# Patient Record
Sex: Female | Born: 1950 | Race: White | Hispanic: No | Marital: Married | Smoking: Never smoker
Health system: Southern US, Community
[De-identification: ages and names within clinical notes are randomized; demographics above are authoritative.]

## PROBLEM LIST (undated history)

## (undated) DIAGNOSIS — K449 Diaphragmatic hernia without obstruction or gangrene: Secondary | ICD-10-CM

## (undated) DIAGNOSIS — K219 Gastro-esophageal reflux disease without esophagitis: Secondary | ICD-10-CM

## (undated) DIAGNOSIS — E785 Hyperlipidemia, unspecified: Secondary | ICD-10-CM

## (undated) DIAGNOSIS — D649 Anemia, unspecified: Secondary | ICD-10-CM

## (undated) DIAGNOSIS — I82409 Acute embolism and thrombosis of unspecified deep veins of unspecified lower extremity: Secondary | ICD-10-CM

## (undated) DIAGNOSIS — L900 Lichen sclerosus et atrophicus: Principal | ICD-10-CM

## (undated) DIAGNOSIS — N814 Uterovaginal prolapse, unspecified: Secondary | ICD-10-CM

## (undated) DIAGNOSIS — F419 Anxiety disorder, unspecified: Secondary | ICD-10-CM

## (undated) DIAGNOSIS — M1990 Unspecified osteoarthritis, unspecified site: Secondary | ICD-10-CM

## (undated) DIAGNOSIS — K589 Irritable bowel syndrome without diarrhea: Secondary | ICD-10-CM

## (undated) HISTORY — DX: Uterovaginal prolapse, unspecified: N81.4

## (undated) HISTORY — DX: Hyperlipidemia, unspecified: E78.5

## (undated) HISTORY — DX: Lichen sclerosus et atrophicus: L90.0

## (undated) HISTORY — DX: Anxiety disorder, unspecified: F41.9

## (undated) HISTORY — DX: Irritable bowel syndrome, unspecified: K58.9

## (undated) HISTORY — DX: Diaphragmatic hernia without obstruction or gangrene: K44.9

---

## 2000-02-16 HISTORY — PX: BREAST BIOPSY: SHX20

## 2004-02-05 ENCOUNTER — Ambulatory Visit: Payer: Self-pay | Admitting: Internal Medicine

## 2004-04-07 ENCOUNTER — Ambulatory Visit: Payer: Self-pay | Admitting: Internal Medicine

## 2004-05-12 ENCOUNTER — Ambulatory Visit: Payer: Self-pay | Admitting: Unknown Physician Specialty

## 2004-09-10 ENCOUNTER — Observation Stay: Payer: Self-pay | Admitting: Internal Medicine

## 2005-01-14 ENCOUNTER — Emergency Department: Payer: Self-pay | Admitting: Emergency Medicine

## 2005-07-14 ENCOUNTER — Encounter: Admission: RE | Admit: 2005-07-14 | Discharge: 2005-07-14 | Payer: Self-pay | Admitting: Unknown Physician Specialty

## 2005-09-15 DIAGNOSIS — R600 Localized edema: Secondary | ICD-10-CM | POA: Insufficient documentation

## 2005-10-16 ENCOUNTER — Other Ambulatory Visit: Payer: Self-pay

## 2005-10-16 ENCOUNTER — Emergency Department: Payer: Self-pay | Admitting: Unknown Physician Specialty

## 2005-11-25 ENCOUNTER — Ambulatory Visit: Payer: Self-pay | Admitting: Internal Medicine

## 2006-04-20 ENCOUNTER — Inpatient Hospital Stay: Payer: Self-pay | Admitting: Internal Medicine

## 2006-04-20 ENCOUNTER — Ambulatory Visit: Payer: Self-pay | Admitting: Unknown Physician Specialty

## 2006-08-23 ENCOUNTER — Other Ambulatory Visit: Payer: Self-pay

## 2006-08-23 ENCOUNTER — Emergency Department: Payer: Self-pay | Admitting: Emergency Medicine

## 2006-08-29 ENCOUNTER — Ambulatory Visit: Payer: Self-pay | Admitting: Unknown Physician Specialty

## 2006-09-09 ENCOUNTER — Ambulatory Visit: Payer: Self-pay | Admitting: Internal Medicine

## 2007-01-31 ENCOUNTER — Ambulatory Visit: Payer: Self-pay

## 2007-09-15 ENCOUNTER — Ambulatory Visit: Payer: Self-pay | Admitting: Unknown Physician Specialty

## 2007-09-19 ENCOUNTER — Ambulatory Visit: Payer: Self-pay | Admitting: Unknown Physician Specialty

## 2007-12-13 ENCOUNTER — Ambulatory Visit: Payer: Self-pay

## 2008-02-16 HISTORY — PX: ANKLE SURGERY: SHX546

## 2008-02-20 ENCOUNTER — Ambulatory Visit: Payer: Self-pay | Admitting: Unknown Physician Specialty

## 2008-03-07 ENCOUNTER — Ambulatory Visit: Payer: Self-pay | Admitting: Unknown Physician Specialty

## 2008-03-28 ENCOUNTER — Ambulatory Visit: Payer: Self-pay | Admitting: Podiatry

## 2008-03-29 ENCOUNTER — Ambulatory Visit: Payer: Self-pay | Admitting: Podiatry

## 2008-07-01 ENCOUNTER — Encounter: Admission: RE | Admit: 2008-07-01 | Discharge: 2008-07-01 | Payer: Self-pay | Admitting: Orthopedic Surgery

## 2008-10-16 ENCOUNTER — Emergency Department (HOSPITAL_COMMUNITY): Admission: EM | Admit: 2008-10-16 | Discharge: 2008-10-16 | Payer: Self-pay | Admitting: Emergency Medicine

## 2009-03-11 ENCOUNTER — Ambulatory Visit: Payer: Self-pay | Admitting: Unknown Physician Specialty

## 2009-03-13 ENCOUNTER — Ambulatory Visit: Payer: Self-pay | Admitting: Unknown Physician Specialty

## 2009-03-18 ENCOUNTER — Ambulatory Visit: Payer: Self-pay | Admitting: Unknown Physician Specialty

## 2009-05-06 ENCOUNTER — Ambulatory Visit: Payer: Self-pay | Admitting: Orthopedic Surgery

## 2009-05-16 ENCOUNTER — Emergency Department: Payer: Self-pay | Admitting: Emergency Medicine

## 2009-06-18 ENCOUNTER — Encounter: Admission: RE | Admit: 2009-06-18 | Discharge: 2009-06-18 | Payer: Self-pay | Admitting: Orthopedic Surgery

## 2009-06-22 ENCOUNTER — Emergency Department: Payer: Self-pay | Admitting: Emergency Medicine

## 2010-02-10 ENCOUNTER — Ambulatory Visit: Payer: Self-pay | Admitting: Unknown Physician Specialty

## 2010-02-15 HISTORY — PX: SHOULDER SURGERY: SHX246

## 2010-02-23 ENCOUNTER — Ambulatory Visit: Payer: Self-pay | Admitting: Unknown Physician Specialty

## 2010-05-20 ENCOUNTER — Ambulatory Visit: Payer: Self-pay | Admitting: Unknown Physician Specialty

## 2010-05-22 LAB — BASIC METABOLIC PANEL
CO2: 27 mEq/L (ref 19–32)
Calcium: 9 mg/dL (ref 8.4–10.5)
Chloride: 106 mEq/L (ref 96–112)
Creatinine, Ser: 0.78 mg/dL (ref 0.4–1.2)
GFR calc Af Amer: >60 mL/min (ref >60)
GFR calc non Af Amer: >60 mL/min (ref >60)
Sodium: 139 mEq/L (ref 135–145)

## 2010-05-22 LAB — DIFFERENTIAL
Basophils Relative: 0 % (ref 0–1)
Monocytes Relative: 7 % (ref 3–12)
Neutrophils Relative %: 68 % (ref 43–77)

## 2010-05-22 LAB — CBC
MCV: 78.7 fL (ref 78.0–100.0)
RBC: 4.4 MIL/uL (ref 3.87–5.11)
RDW: 14.6 % (ref 11.5–15.5)
WBC: 11.6 10*3/uL — ABNORMAL HIGH (ref 4.0–10.5)

## 2010-05-22 LAB — POCT CARDIAC MARKERS: Myoglobin, poc: 61.4 ng/mL (ref 12–200)

## 2010-07-28 ENCOUNTER — Ambulatory Visit: Payer: Self-pay | Admitting: Cardiology

## 2010-09-19 ENCOUNTER — Emergency Department: Payer: Self-pay | Admitting: Emergency Medicine

## 2011-03-15 ENCOUNTER — Ambulatory Visit: Payer: Self-pay

## 2011-07-14 ENCOUNTER — Ambulatory Visit: Payer: Self-pay | Admitting: Unknown Physician Specialty

## 2011-07-16 ENCOUNTER — Ambulatory Visit: Payer: Self-pay | Admitting: Unknown Physician Specialty

## 2011-11-02 ENCOUNTER — Ambulatory Visit: Payer: Self-pay | Admitting: Unknown Physician Specialty

## 2011-11-12 ENCOUNTER — Other Ambulatory Visit: Payer: Self-pay | Admitting: Internal Medicine

## 2011-11-18 LAB — CULTURE, BLOOD (SINGLE)

## 2011-12-02 ENCOUNTER — Ambulatory Visit: Payer: Self-pay | Admitting: Unknown Physician Specialty

## 2012-01-31 ENCOUNTER — Ambulatory Visit: Payer: Self-pay | Admitting: Unknown Physician Specialty

## 2012-02-02 LAB — PATHOLOGY REPORT

## 2012-02-16 HISTORY — PX: HAND SURGERY: SHX662

## 2012-03-09 ENCOUNTER — Encounter: Payer: Self-pay | Admitting: Rheumatology

## 2012-03-18 ENCOUNTER — Encounter: Payer: Self-pay | Admitting: Rheumatology

## 2012-04-15 ENCOUNTER — Encounter: Payer: Self-pay | Admitting: Rheumatology

## 2012-05-30 ENCOUNTER — Ambulatory Visit: Payer: Self-pay | Admitting: Hematology and Oncology

## 2012-05-30 LAB — CBC CANCER CENTER
Basophil %: 0.8 %
Eosinophil #: 0.3 x10 3/mm (ref 0.0–0.7)
Eosinophil %: 3.2 %
HCT: 38.9 % (ref 35.0–47.0)
HGB: 12.9 g/dL (ref 12.0–16.0)
Lymphocyte #: 1.8 x10 3/mm (ref 1.0–3.6)
Lymphocyte %: 22.1 %
MCH: 25.8 pg — ABNORMAL LOW (ref 26.0–34.0)
MCHC: 33.2 g/dL (ref 32.0–36.0)
Monocyte %: 8.4 %
Neutrophil %: 65.5 %
RDW: 14.9 % — ABNORMAL HIGH (ref 11.5–14.5)

## 2012-05-30 LAB — FERRITIN: Ferritin (ARMC): 13 ng/mL (ref 8–388)

## 2012-05-30 LAB — IRON AND TIBC
Iron Bind.Cap.(Total): 415 ug/dL (ref 250–450)
Iron Saturation: 18 %
Unbound Iron-Bind.Cap.: 340 ug/dL

## 2012-06-15 ENCOUNTER — Ambulatory Visit: Payer: Self-pay | Admitting: Hematology and Oncology

## 2012-08-22 ENCOUNTER — Ambulatory Visit: Payer: Self-pay | Admitting: Unknown Physician Specialty

## 2012-09-04 ENCOUNTER — Ambulatory Visit: Payer: Self-pay | Admitting: Hematology and Oncology

## 2012-09-05 LAB — RETICULOCYTES: Reticulocyte: 1.65 %

## 2012-09-05 LAB — CBC CANCER CENTER
Basophil %: 1.4 %
Eosinophil %: 5.1 %
HCT: 35.6 % (ref 35.0–47.0)
HGB: 11.7 g/dL — ABNORMAL LOW (ref 12.0–16.0)
Lymphocyte #: 2.6 x10 3/mm (ref 1.0–3.6)
MCHC: 32.8 g/dL (ref 32.0–36.0)
Monocyte #: 0.8 x10 3/mm (ref 0.2–0.9)
Neutrophil %: 57.1 %
Platelet: 281 x10 3/mm (ref 150–440)
RBC: 4.55 10*6/uL (ref 3.80–5.20)

## 2012-09-05 LAB — IRON AND TIBC
Iron Bind.Cap.(Total): 387 ug/dL (ref 250–450)
Iron Saturation: 11 %
Unbound Iron-Bind.Cap.: 345 ug/dL

## 2012-09-15 ENCOUNTER — Ambulatory Visit: Payer: Self-pay | Admitting: Hematology and Oncology

## 2012-10-05 LAB — CBC CANCER CENTER
Basophil #: 0.1 x10 3/mm (ref 0.0–0.1)
Basophil %: 1.1 %
HCT: 38.4 % (ref 35.0–47.0)
HGB: 13.2 g/dL (ref 12.0–16.0)
Lymphocyte #: 2.4 x10 3/mm (ref 1.0–3.6)
Lymphocyte %: 27.4 %
MCHC: 34.3 g/dL (ref 32.0–36.0)
MCV: 80 fL (ref 80–100)
Monocyte %: 8.3 %
Neutrophil #: 5.2 x10 3/mm (ref 1.4–6.5)
Platelet: 229 x10 3/mm (ref 150–440)
RDW: 17 % — ABNORMAL HIGH (ref 11.5–14.5)

## 2012-10-05 LAB — IRON AND TIBC
Iron Bind.Cap.(Total): 302 ug/dL (ref 250–450)
Iron: 78 ug/dL (ref 50–170)
Unbound Iron-Bind.Cap.: 224 ug/dL

## 2012-10-16 ENCOUNTER — Ambulatory Visit: Payer: Self-pay | Admitting: Hematology and Oncology

## 2013-02-02 ENCOUNTER — Ambulatory Visit: Payer: Self-pay | Admitting: General Practice

## 2013-04-11 ENCOUNTER — Emergency Department: Payer: Self-pay | Admitting: Emergency Medicine

## 2013-08-25 ENCOUNTER — Emergency Department: Payer: Self-pay | Admitting: Emergency Medicine

## 2013-10-09 ENCOUNTER — Other Ambulatory Visit: Payer: Self-pay | Admitting: Internal Medicine

## 2013-10-09 LAB — TROPONIN I

## 2013-11-29 ENCOUNTER — Ambulatory Visit: Payer: Self-pay

## 2013-11-30 LAB — BASIC METABOLIC PANEL
Anion Gap: 8 (ref 7–16)
BUN: 24 mg/dL — ABNORMAL HIGH (ref 7–18)
CO2: 27 mmol/L (ref 21–32)
CREATININE: 0.75 mg/dL (ref 0.60–1.30)
Calcium, Total: 9.5 mg/dL (ref 8.5–10.1)
Chloride: 103 mmol/L (ref 98–107)
EGFR (African American): >60
EGFR (Non-African Amer.): >60
GLUCOSE: 104 mg/dL — AB (ref 65–99)
Osmolality: 280 (ref 275–301)
POTASSIUM: 4.2 mmol/L (ref 3.5–5.1)
SODIUM: 138 mmol/L (ref 136–145)

## 2013-11-30 LAB — IRON AND TIBC
IRON: 68 ug/dL (ref 50–170)
Iron Bind.Cap.(Total): 313 ug/dL (ref 250–450)
Iron Saturation: 22 %
UNBOUND IRON-BIND. CAP.: 245 ug/dL

## 2013-11-30 LAB — CBC CANCER CENTER
BASOS ABS: 0.1 x10 3/mm (ref 0.0–0.1)
Basophil %: 1 %
Eosinophil #: 0.2 x10 3/mm (ref 0.0–0.7)
Eosinophil %: 2.7 %
HCT: 41 % (ref 35.0–47.0)
HGB: 13.4 g/dL (ref 12.0–16.0)
LYMPHS PCT: 21.9 %
Lymphocyte #: 1.7 x10 3/mm (ref 1.0–3.6)
MCH: 27.5 pg (ref 26.0–34.0)
MCHC: 32.8 g/dL (ref 32.0–36.0)
MCV: 84 fL (ref 80–100)
MONO ABS: 0.7 x10 3/mm (ref 0.2–0.9)
Monocyte %: 8.6 %
NEUTROS ABS: 5.2 x10 3/mm (ref 1.4–6.5)
Neutrophil %: 65.8 %
PLATELETS: 280 x10 3/mm (ref 150–440)
RBC: 4.88 10*6/uL (ref 3.80–5.20)
RDW: 14.2 % (ref 11.5–14.5)
WBC: 7.9 x10 3/mm (ref 3.6–11.0)

## 2013-11-30 LAB — FERRITIN: Ferritin (ARMC): 191 ng/mL (ref 8–388)

## 2013-12-16 ENCOUNTER — Ambulatory Visit: Payer: Self-pay

## 2014-02-13 DIAGNOSIS — J069 Acute upper respiratory infection, unspecified: Secondary | ICD-10-CM | POA: Insufficient documentation

## 2014-04-24 ENCOUNTER — Ambulatory Visit
Admit: 2014-04-24 | Disposition: A | Discharge status: Home / Self Care | Payer: Self-pay | Attending: Hematology and Oncology | Admitting: Hematology and Oncology

## 2014-05-17 ENCOUNTER — Ambulatory Visit
Admit: 2014-05-17 | Disposition: A | Discharge status: Home / Self Care | Payer: Self-pay | Attending: Hematology and Oncology | Admitting: Hematology and Oncology

## 2014-06-10 LAB — CBC CANCER CENTER
Basophil #: 0.1 x10 3/mm (ref 0.0–0.1)
Basophil %: 0.8 %
Eosinophil #: 0.2 x10 3/mm (ref 0.0–0.7)
Eosinophil %: 3.1 %
HCT: 39.3 % (ref 35.0–47.0)
HGB: 13 g/dL (ref 12.0–16.0)
Lymphocyte #: 1.8 x10 3/mm (ref 1.0–3.6)
Lymphocyte %: 23.4 %
MCH: 27.8 pg (ref 26.0–34.0)
MCHC: 33.1 g/dL (ref 32.0–36.0)
MCV: 84 fL (ref 80–100)
Monocyte #: 0.6 x10 3/mm (ref 0.2–0.9)
Monocyte %: 7.4 %
Neutrophil #: 5.1 x10 3/mm (ref 1.4–6.5)
Neutrophil %: 65.3 %
Platelet: 251 x10 3/mm (ref 150–440)
RBC: 4.68 10*6/uL (ref 3.80–5.20)
RDW: 13.7 % (ref 11.5–14.5)
WBC: 7.9 x10 3/mm (ref 3.6–11.0)

## 2014-06-10 LAB — FERRITIN: Ferritin (ARMC): 148 ng/mL

## 2014-09-24 ENCOUNTER — Other Ambulatory Visit: Payer: Self-pay | Admitting: Unknown Physician Specialty

## 2014-09-24 ENCOUNTER — Encounter: Payer: Self-pay | Admitting: *Deleted

## 2014-09-24 DIAGNOSIS — R928 Other abnormal and inconclusive findings on diagnostic imaging of breast: Secondary | ICD-10-CM

## 2014-09-27 ENCOUNTER — Ambulatory Visit
Admission: RE | Admit: 2014-09-27 | Discharge: 2014-09-27 | Disposition: A | Discharge status: Home / Self Care | Payer: BLUE CROSS/BLUE SHIELD | Source: Ambulatory Visit | Attending: Unknown Physician Specialty | Admitting: Unknown Physician Specialty

## 2014-09-27 DIAGNOSIS — N6002 Solitary cyst of left breast: Secondary | ICD-10-CM | POA: Insufficient documentation

## 2014-09-27 DIAGNOSIS — R928 Other abnormal and inconclusive findings on diagnostic imaging of breast: Secondary | ICD-10-CM | POA: Diagnosis present

## 2014-09-30 ENCOUNTER — Encounter: Payer: Self-pay | Admitting: General Surgery

## 2014-10-01 ENCOUNTER — Ambulatory Visit (INDEPENDENT_AMBULATORY_CARE_PROVIDER_SITE_OTHER): Payer: BLUE CROSS/BLUE SHIELD | Admitting: General Surgery

## 2014-10-01 ENCOUNTER — Other Ambulatory Visit: Payer: Self-pay | Admitting: General Surgery

## 2014-10-01 ENCOUNTER — Encounter: Payer: Self-pay | Admitting: General Surgery

## 2014-10-01 ENCOUNTER — Other Ambulatory Visit: Payer: BLUE CROSS/BLUE SHIELD

## 2014-10-01 VITALS — BP 148/60 | HR 88 | Resp 12 | Ht 62.0 in | Wt 166.0 lb

## 2014-10-01 DIAGNOSIS — N63 Unspecified lump in breast: Secondary | ICD-10-CM | POA: Diagnosis not present

## 2014-10-01 DIAGNOSIS — N6320 Unspecified lump in the left breast, unspecified quadrant: Secondary | ICD-10-CM

## 2014-10-01 HISTORY — PX: FINE NEEDLE ASPIRATION: SHX406

## 2014-10-01 NOTE — Patient Instructions (Addendum)
The patient is aware to call back for any questions or concerns. Stop antibiotic. May take aleve or advil and heated pad  as needed for comfort.

## 2014-10-01 NOTE — Progress Notes (Signed)
Patient ID: Janet Phillips, female   DOB: [birth year], 64 y.o.   MRN: [id number]  Chief Complaint  Patient presents with  . Breast Problem    HPI Janet Phillips is a 64 y.o. female.  who presents for a breast evaluation. The most recent mammogram was done on 09-20-14. She then had added views and an ultrasound was 09-27-14.  She states that her and Dr Vernie Ammons have been watching this spot for about 4-5 years. She said the radiologist told her it has has changed with inflammation or infection and she started an antibiotic on Saturday. She has not noticed any change since the antibiotic. Patient does perform regular self breast checks and gets regular mammograms done.  She has had a right breast biopsy in the past that was negative. She states she has felt a lump in the left breast and had left breast pain since August 2. She has recently lost 41 pounds over 8 months through a medical program in [place]. She has not been on the weight loss program in several months since she is helping with her disabled son. Denies any breat injury or trauma. Denies family history of breast cancer.  HPI  Past Medical History  Diagnosis Date  . Hiatal hernia   . IBS (irritable bowel syndrome)     Past Surgical History  Procedure Laterality Date  . Breast biopsy Right 2002    neg/ Dr Bary Castilla  . Ankle surgery Left 2010    torn ligament  . Shoulder surgery Right 2012    torn ligament  . Hand surgery Bilateral 2014    No family history on file.  Social History Social History  Substance Use Topics  . Smoking status: Never Smoker   . Smokeless tobacco: Never Used  . Alcohol Use: No    Allergies  Allergen Reactions  . Erythromycin Base Nausea And Vomiting  . Amoxicillin Rash    Current Outpatient Prescriptions  Medication Sig Dispense Refill  . ALPRAZolam (XANAX) 0.5 MG tablet Take 0.5 mg by mouth daily.    . clindamycin (CLEOCIN) 150 MG capsule Take by mouth 3 (three) times daily.    .  fluticasone (FLONASE) 50 MCG/ACT nasal spray Place into both nostrils as needed for allergies or rhinitis.    Marland Kitchen loratadine (CLARITIN) 10 MG tablet Take 10 mg by mouth daily as needed for allergies.     No current facility-administered medications for this visit.    Review of Systems Review of Systems  Constitutional: Negative.   Respiratory: Negative.   Cardiovascular: Negative.     Blood pressure 148/60, pulse 88, resp. rate 12, height 5\' 2"  (1.575 m), weight 166 lb (75.297 kg).  Physical Exam Physical Exam  Constitutional: She is oriented to person, place, and time. She appears well-developed and well-nourished.  HENT:  Mouth/Throat: Oropharynx is clear and moist.  Eyes: Conjunctivae are normal. No scleral icterus.  Neck: Neck supple.  Cardiovascular: Normal rate, regular rhythm and normal heart sounds.   Pulmonary/Chest: Effort normal and breath sounds normal. Right breast exhibits no inverted nipple, no mass, no nipple discharge, no skin change and no tenderness. Left breast exhibits mass. Left breast exhibits no inverted nipple, no nipple discharge, no skin change and no tenderness.    Well healed incision right breast at 8 o'clock. Left breast 6 o'clock 4 CFN 1.5 cm mass.  Lymphadenopathy:    She has no cervical adenopathy.  Neurological: She is alert and oriented to person, place, and time.  Skin:  Skin is warm and dry.  Psychiatric: Her behavior is normal.    Data Reviewed 2015-2016 mammograms from [place]. Previously identified density in the 6:00 position of the left breast is enlarged compared to past studies.  Office notes from Einar Crow, M.D. dated 09/20/2014.  Diagnostic left breast mammogram and ultrasound dated 09/27/2014 were reviewed. The mass reportedly was smaller than on her original films of 09/20/2014 and similar to that noted on 08/01/2013. Ultrasound suggested a complicated cyst with possible infection and surrounding edema.  Follow-up  ultrasound record in 2 months. BI-RADS-3.  2002 right breast biopsy showed fibrocystic changes, apical metaplasia, ductal epithelial hyperplasia without atypia and a small fibroadenoma.    Ultrasound examination of the left breast in the 6:00 position 4 cm from the nipple showed a hypoechoic accord/anechoic area with posterior acoustic enhancement surrounded by a fairly prominent rim of tissue. In greatest dimension this measured 1.3 x 1.5 x 1.75 cm. The cystic component measuring approximately 1.0 cm. The aspiration was completed using 1 mL of 1% plain Xylocaine. A 20-gauge needle was used and a small volume of thick fluid obtained. There was incomplete resolution of the cystic component. Multiple passes to the thickened wall were completed. Slides 4 were prepared for cytology.  Assessment    Complex cyst left breast, symptomatic. Likely trauma related.    Plan    The patient is still symptomatic in this area in spite of 3 days her anabiotic therapy. The history of a long-standing cyst with recent increase in size without nipple oral contact is more suggestive of an inflammatory process rather than infection. It was elected to proceed to aspiration to assess this and to help relieve the patient's discomfort.   The patient is having significant GI distress with clindamycin, and this will be discontinued pending cytology. While she has a past history of reflux, she was asked to make use of either to leave, twice a day or 2 Advil 4 times a day for a few days to see if this would not relieve her discomfort. Local heat application was encouraged.  We'll arrange for follow-up examination in 2 weeks, earlier if she becomes more symptomatic.      PCP:  [facility]  Ref Dr Ricka Burdock 10/02/2014, 6:39 AM

## 2014-10-02 DIAGNOSIS — N6320 Unspecified lump in the left breast, unspecified quadrant: Secondary | ICD-10-CM | POA: Insufficient documentation

## 2014-10-02 HISTORY — PX: BREAST CYST ASPIRATION: SHX578

## 2014-10-03 ENCOUNTER — Telehealth: Payer: Self-pay | Admitting: *Deleted

## 2014-10-03 NOTE — Telephone Encounter (Signed)
-----   Message from Robert Bellow, MD sent at 10/03/2014  4:14 PM EDT ----- Please notify the patient the cytology report was fine. No evidence of infection or malignancy. Continue present and time inflammatory agents and will get together in 2 weeks as originally planned.  ----- Message -----    From: [facility]    Sent: 10/03/2014   1:34 PM      To: Robert Bellow, MD

## 2014-10-04 NOTE — Telephone Encounter (Signed)
Notified patient as instructed, patient pleased. Discussed follow-up appointments, patient agrees  

## 2014-10-16 ENCOUNTER — Ambulatory Visit (INDEPENDENT_AMBULATORY_CARE_PROVIDER_SITE_OTHER): Payer: BLUE CROSS/BLUE SHIELD | Admitting: General Surgery

## 2014-10-16 ENCOUNTER — Encounter: Payer: Self-pay | Admitting: General Surgery

## 2014-10-16 VITALS — BP 140/60 | HR 74 | Resp 14 | Ht 62.0 in | Wt 167.0 lb

## 2014-10-16 DIAGNOSIS — N6320 Unspecified lump in the left breast, unspecified quadrant: Secondary | ICD-10-CM

## 2014-10-16 DIAGNOSIS — N63 Unspecified lump in breast: Secondary | ICD-10-CM | POA: Diagnosis not present

## 2014-10-16 NOTE — Progress Notes (Signed)
Patient ID: Janet Phillips, female   DOB: October 17, 1950, 64 y.o.   MRN: [id number]  Chief Complaint  Patient presents with  . Follow-up    left breast biopsy    HPI Janet Phillips is a 64 y.o. female.  Here today for follow up needle aspiration left breast. States she is doing well.   HPI  Past Medical History  Diagnosis Date  . Hiatal hernia   . IBS (irritable bowel syndrome)     Past Surgical History  Procedure Laterality Date  . Breast biopsy Right 2002    neg/ Dr Bary Castilla  . Ankle surgery Left 2010    torn ligament  . Shoulder surgery Right 2012    torn ligament  . Hand surgery Bilateral 2014  . Fine needle aspiration Left 10-01-14    benign    No family history on file.  Social History Social History  Substance Use Topics  . Smoking status: Never Smoker   . Smokeless tobacco: Never Used  . Alcohol Use: No    Allergies  Allergen Reactions  . Erythromycin Base Nausea And Vomiting  . Amoxicillin Rash    Current Outpatient Prescriptions  Medication Sig Dispense Refill  . ALPRAZolam (XANAX) 0.5 MG tablet Take 0.5 mg by mouth daily.    . fluticasone (FLONASE) 50 MCG/ACT nasal spray Place into both nostrils as needed for allergies or rhinitis.    Marland Kitchen loratadine (CLARITIN) 10 MG tablet Take 10 mg by mouth daily as needed for allergies.     No current facility-administered medications for this visit.    Review of Systems Review of Systems  Constitutional: Negative.   Respiratory: Negative.   Cardiovascular: Negative.     Blood pressure 140/60, pulse 74, resp. rate 14, height 5\' 2"  (1.575 m), weight 167 lb (75.751 kg).  Physical Exam Physical Exam  Constitutional: She is oriented to person, place, and time. She appears well-developed and well-nourished.  Eyes: No scleral icterus.  Pulmonary/Chest: Right breast exhibits no inverted nipple, no mass, no nipple discharge, no skin change and no tenderness. Left breast exhibits no inverted nipple, no mass, no  nipple discharge, no skin change and no tenderness.    Lymphadenopathy:    She has no cervical adenopathy.    She has no axillary adenopathy.  Neurological: She is alert and oriented to person, place, and time.  Skin: Skin is warm and dry.  Psychiatric: Her behavior is normal.    Data Reviewed 10/01/2014 cyst aspiration: Diagnosis CYST CONTENTS AND VERY SCANT PROLIFERATIVE DUCTAL EPITHELIUM SEE COMMENT. COMMENT: ALTHOUGH THE PROLIFERATIVE DUCT EPITHELIUM DEMONSTRATES FEATURES OF USUAL DUCTAL HYPERPLASIA, THE EPITHELIUM IS EXTREMELY SCANT MAY NOT BE REPRESENTATIVE.  Assessment    Benign breast exam.    Plan    The inflammatory process appears to have resolved. Proliferative ductal epithelium does not warrant additional sampling with resolution of symptoms and benign clinical exam. Likely trauma related.    Follow up as needed. Continue self breast exams. Call office for any new breast issues or concerns.    PCP:  Salli Quarry M 10/16/2014, 9:20 AM

## 2014-10-16 NOTE — Patient Instructions (Signed)
The patient is aware to call back for any questions or concerns. Continue self breast exams. Call office for any new breast issues or concerns. 

## 2014-10-29 ENCOUNTER — Encounter: Payer: Self-pay | Admitting: General Surgery

## 2014-11-21 DIAGNOSIS — M8580 Other specified disorders of bone density and structure, unspecified site: Secondary | ICD-10-CM | POA: Insufficient documentation

## 2014-12-10 ENCOUNTER — Inpatient Hospital Stay: Payer: BLUE CROSS/BLUE SHIELD | Attending: Hematology and Oncology

## 2014-12-30 ENCOUNTER — Encounter: Payer: Self-pay | Admitting: General Surgery

## 2015-01-21 ENCOUNTER — Encounter: Payer: Self-pay | Admitting: Podiatry

## 2015-01-21 ENCOUNTER — Ambulatory Visit (INDEPENDENT_AMBULATORY_CARE_PROVIDER_SITE_OTHER): Payer: BLUE CROSS/BLUE SHIELD | Admitting: Podiatry

## 2015-01-21 ENCOUNTER — Ambulatory Visit (INDEPENDENT_AMBULATORY_CARE_PROVIDER_SITE_OTHER): Payer: BLUE CROSS/BLUE SHIELD

## 2015-01-21 VITALS — BP 139/62 | HR 68 | Resp 16

## 2015-01-21 DIAGNOSIS — M7752 Other enthesopathy of left foot: Secondary | ICD-10-CM

## 2015-01-21 DIAGNOSIS — L84 Corns and callosities: Secondary | ICD-10-CM | POA: Diagnosis not present

## 2015-01-21 DIAGNOSIS — K219 Gastro-esophageal reflux disease without esophagitis: Secondary | ICD-10-CM | POA: Insufficient documentation

## 2015-01-21 DIAGNOSIS — I252 Old myocardial infarction: Secondary | ICD-10-CM | POA: Insufficient documentation

## 2015-01-21 DIAGNOSIS — M71572 Other bursitis, not elsewhere classified, left ankle and foot: Secondary | ICD-10-CM | POA: Diagnosis not present

## 2015-01-21 DIAGNOSIS — G4700 Insomnia, unspecified: Secondary | ICD-10-CM | POA: Insufficient documentation

## 2015-01-21 DIAGNOSIS — F419 Anxiety disorder, unspecified: Secondary | ICD-10-CM | POA: Insufficient documentation

## 2015-01-21 DIAGNOSIS — M779 Enthesopathy, unspecified: Secondary | ICD-10-CM | POA: Diagnosis not present

## 2015-01-21 DIAGNOSIS — I82409 Acute embolism and thrombosis of unspecified deep veins of unspecified lower extremity: Secondary | ICD-10-CM | POA: Insufficient documentation

## 2015-01-21 DIAGNOSIS — M79672 Pain in left foot: Secondary | ICD-10-CM | POA: Diagnosis not present

## 2015-01-21 DIAGNOSIS — D649 Anemia, unspecified: Secondary | ICD-10-CM | POA: Insufficient documentation

## 2015-01-21 DIAGNOSIS — I10 Essential (primary) hypertension: Secondary | ICD-10-CM | POA: Insufficient documentation

## 2015-01-21 DIAGNOSIS — Z87898 Personal history of other specified conditions: Secondary | ICD-10-CM | POA: Insufficient documentation

## 2015-01-21 DIAGNOSIS — E559 Vitamin D deficiency, unspecified: Secondary | ICD-10-CM | POA: Insufficient documentation

## 2015-01-21 DIAGNOSIS — M1990 Unspecified osteoarthritis, unspecified site: Secondary | ICD-10-CM | POA: Insufficient documentation

## 2015-01-21 DIAGNOSIS — E785 Hyperlipidemia, unspecified: Secondary | ICD-10-CM | POA: Insufficient documentation

## 2015-01-21 DIAGNOSIS — K589 Irritable bowel syndrome without diarrhea: Secondary | ICD-10-CM | POA: Insufficient documentation

## 2015-01-21 DIAGNOSIS — E079 Disorder of thyroid, unspecified: Secondary | ICD-10-CM | POA: Insufficient documentation

## 2015-01-21 DIAGNOSIS — J309 Allergic rhinitis, unspecified: Secondary | ICD-10-CM | POA: Insufficient documentation

## 2015-01-21 NOTE — Progress Notes (Signed)
   Subjective:    Patient ID: Janet Phillips, female    DOB: 09-03-1950, 64 y.o.   MRN: [id number]  HPI: She presents today with several month's duration of pain sub-fifth metatarsophalangeal joint of the left foot. She states that since her surgery on her left heel where she tore tendon she has had pain across the forefoot particularly beneath the fifth metatarsal. She states that there is a corn or calluses present and it is extremely painful at all times when she is walking on it. She tried nothing other than debridement.    Review of Systems  All other systems reviewed and are negative.      Objective:   Physical Exam: 64 year old female vital signs stable alert and oriented 3 no. Distress. Pulses are strongly palpable. Neurologic sensorium is intact per Semmes-Weinstein monofilament. Deep tendon reflexes are intact bilateral and muscle strength was 5 over 5 dorsiflexion plantar flexors and inverters everters all intrinsic musculature is intact. Orthopedic evaluation demonstrates gastroc equinus left foot as opposed to the right. She has a palpable soft tissue fluctuance beneath the fifth metatarsal head of the left foot consistent with bursitis. She has reactive hyperkeratosis was sub-first sub-third and sub-fifth metatarsals forefoot left. Radiographic evaluation demonstrates normal osseous architecture. Cutaneous evaluation demonstrates reactive hyperkeratoses no open lesions or wounds.        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  Plan: Discussed etiology pathology conservative versus surgical therapies. Injected the area today with dexamethasone and local anesthetic. I debrided reactive hyperkeratoses today for foot 3 left foot. I also discussed in great detail the need for a gastroc recession of her left foot. She understands that and is amenable to it. Follow up with her in 1 month.

## 2015-01-24 ENCOUNTER — Emergency Department: Payer: BLUE CROSS/BLUE SHIELD

## 2015-01-24 ENCOUNTER — Emergency Department
Admission: EM | Admit: 2015-01-24 | Discharge: 2015-01-24 | Disposition: A | Discharge status: Home / Self Care | Payer: BLUE CROSS/BLUE SHIELD | Attending: Emergency Medicine | Admitting: Emergency Medicine

## 2015-01-24 ENCOUNTER — Encounter: Payer: Self-pay | Admitting: Emergency Medicine

## 2015-01-24 DIAGNOSIS — Z880 Allergy status to penicillin: Secondary | ICD-10-CM | POA: Insufficient documentation

## 2015-01-24 DIAGNOSIS — Z79899 Other long term (current) drug therapy: Secondary | ICD-10-CM | POA: Diagnosis not present

## 2015-01-24 DIAGNOSIS — M79605 Pain in left leg: Secondary | ICD-10-CM | POA: Insufficient documentation

## 2015-01-24 DIAGNOSIS — M79606 Pain in leg, unspecified: Secondary | ICD-10-CM

## 2015-01-24 DIAGNOSIS — M79662 Pain in left lower leg: Secondary | ICD-10-CM

## 2015-01-24 HISTORY — DX: Acute embolism and thrombosis of unspecified deep veins of unspecified lower extremity: I82.409

## 2015-01-24 NOTE — Discharge Instructions (Signed)
You were evaluated for left leg pain, and although no certain cause was found, your exam and evaluation are reassuring. There is no evidence of blood clot on ultrasound imaging. Follow up with primary care physician. Return to the emergency department for any worsening condition including worsening pain, swelling, weakness, numbness, skin rash, or any other symptoms concerning to you.   Musculoskeletal Pain Musculoskeletal pain is muscle and boney aches and pains. These pains can occur in any part of the body. Your caregiver may treat you without knowing the cause of the pain. They may treat you if blood or urine tests, X-rays, and other tests were normal.  CAUSES There is often not a definite cause or reason for these pains. These pains may be caused by a type of germ (virus). The discomfort may also come from overuse. Overuse includes working out too hard when your body is not fit. Boney aches also come from weather changes. Bone is sensitive to atmospheric pressure changes. HOME CARE INSTRUCTIONS   Ask when your test results will be ready. Make sure you get your test results.  Only take over-the-counter or prescription medicines for pain, discomfort, or fever as directed by your caregiver. If you were given medications for your condition, do not drive, operate machinery or power tools, or sign legal documents for 24 hours. Do not drink alcohol. Do not take sleeping pills or other medications that may interfere with treatment.  Continue all activities unless the activities cause more pain. When the pain lessens, slowly resume normal activities. Gradually increase the intensity and duration of the activities or exercise.  During periods of severe pain, bed rest may be helpful. Lay or sit in any position that is comfortable.  Putting ice on the injured area.  Put ice in a bag.  Place a towel between your skin and the bag.  Leave the ice on for 15 to 20 minutes, 3 to 4 times a day.  Follow up  with your caregiver for continued problems and no reason can be found for the pain. If the pain becomes worse or does not go away, it may be necessary to repeat tests or do additional testing. Your caregiver may need to look further for a possible cause. SEEK IMMEDIATE MEDICAL CARE IF:  You have pain that is getting worse and is not relieved by medications.  You develop chest pain that is associated with shortness or breath, sweating, feeling sick to your stomach (nauseous), or throw up (vomit).  Your pain becomes localized to the abdomen.  You develop any new symptoms that seem different or that concern you. MAKE SURE YOU:   Understand these instructions.  Will watch your condition.  Will get help right away if you are not doing well or get worse.   This information is not intended to replace advice given to you by your health care provider. Make sure you discuss any questions you have with your health care provider.   Document Released: 02/01/2005 Document Revised: 04/26/2011 Document Reviewed: 10/06/2012 Elsevier Interactive Patient Education [employer].

## 2015-01-24 NOTE — ED Notes (Signed)
Pt from [facility] . Left calf pain starting this AM , DVT in 2008 with similar pain, tightness to left ankle no injury

## 2015-01-24 NOTE — ED Notes (Signed)
Pt complains of pain to left calf that started this morning. Pt denies redness or swelling to site. Pt states last DVT was 2008.

## 2015-01-24 NOTE — ED Provider Notes (Signed)
[facility] Emergency Department Provider Note   ____________________________________________  Time seen:  I have reviewed the triage vital signs and the triage nursing note.  HISTORY  Chief Complaint Leg Pain   Historian Patient  HPI Janet Phillips is a 64 y.o. female who is here for evaluation of left ankle swelling and left calf pain since yesterday, and slightly worse this morning. Patient states on Tuesday she had a callus on her left foot injected, and did fine initially and then started to have some swelling in the ankle yesterday. This morning she had some pain in left calf. She has had a previous of DVT in the right lower extremity several years ago and this was reportedly unprovoked but she is no longer on any anticoagulants for several years. Trouble breathing. No fever. No tachycardia.Symptoms are mild to moderate. No skin rash.    Past Medical History  Diagnosis Date  . Hiatal hernia   . IBS (irritable bowel syndrome)   . DVT (deep venous thrombosis) [facility])     Patient Active Problem List   Diagnosis Date Noted  . Allergic rhinitis 01/21/2015  . Absolute anemia 01/21/2015  . Anxiety 01/21/2015  . Arthritis 01/21/2015  . Deep vein thrombosis (DVT) ([place]) 01/21/2015  . Acid reflux 01/21/2015  . Personal history of other specified conditions 01/21/2015  . H/O acute myocardial infarction 01/21/2015  . HLD (hyperlipidemia) 01/21/2015  . BP (high blood pressure) 01/21/2015  . 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 Disease of thyroid gland 01/21/2015  . Avitaminosis D 01/21/2015  . Cannot sleep 01/21/2015  . Osteopenia 11/21/2014  . Breast mass, left 10/02/2014  . Infection of the upper respiratory tract 02/13/2014  . Edema leg 09/15/2005    Past Surgical History  Procedure Laterality Date  . Breast biopsy Right 2002    neg/ Dr Bary Castilla  . Ankle surgery Left 2010    torn ligament  . Shoulder surgery Right 2012    torn ligament  . Hand  surgery Bilateral 2014  . Fine needle aspiration Left 10-01-14    benign    Current Outpatient Rx  Name  Route  Sig  Dispense  Refill  . ALPRAZolam (XANAX) 0.5 MG tablet   Oral   Take 0.5 mg by mouth daily.         Marland Kitchen azelastine (ASTELIN) 0.1 % nasal spray      PLACE 1 SPRAY INTO BOTH NOSTRILS 2 (TWO) TIMES DAILY FOR 7 DAYS. THEN TWICE A DAY AS NEEDED      5   . doxycycline (VIBRAMYCIN) 100 MG capsule      TAKE 1 CAPSULE (100 MG TOTAL) BY MOUTH 2 (TWO) TIMES DAILY FOR 7 DAYS.      0   . fluconazole (DIFLUCAN) 150 MG tablet      TAKE 1 TABLET ONCE. MAY REPEAT ONCE IF SYMPTOMS RECUR.      1   . fluticasone (FLONASE) 50 MCG/ACT nasal spray   Each Nare   Place into both nostrils as needed for allergies or rhinitis.         Marland Kitchen guaiFENesin-codeine (ROBITUSSIN AC) 100-10 MG/5ML syrup   Oral   Take by mouth.         . loratadine (CLARITIN) 10 MG tablet   Oral   Take 10 mg by mouth daily as needed for allergies.         . Vitamin D, Ergocalciferol, (DRISDOL) 50000 UNITS CAPS capsule      TAKE 1  CAPSULE (50,000 UNITS TOTAL) BY MOUTH ONCE A WEEK.      3     Allergies Azithromycin; Erythromycin base; Hydrocodone-acetaminophen; Lansoprazole; Levofloxacin in d5w; Prednisone; Amoxicillin; and Cefuroxime axetil  No family history on file.  Social History Social History  Substance Use Topics  . Smoking status: Never Smoker   . Smokeless tobacco: Never Used  . Alcohol Use: No    Review of Systems  Constitutional: Negative for fever. Eyes: Negative for visual changes. ENT: Negative for sore throat. Cardiovascular: Negative for chest pain. Respiratory: Negative for shortness of breath. Gastrointestinal: Negative for abdominal pain, vomiting and diarrhea. Genitourinary: Negative for dysuria. Musculoskeletal: Negative for back pain. Skin: Negative for rash. Neurological: Negative for headache. 10 point Review of Systems otherwise  negative ____________________________________________   PHYSICAL EXAM:  VITAL SIGNS: ED Triage Vitals  Enc Vitals Group     BP 01/24/15 1655 177/70 mmHg     Pulse Rate 01/24/15 1655 91     Resp 01/24/15 1655 18     Temp 01/24/15 1655 98.3 F (36.8 C)     Temp Source 01/24/15 1655 Oral     SpO2 01/24/15 1655 100 %     Weight 01/24/15 1655 181 lb (82.101 kg)     Height 01/24/15 1655 5\' 1"  (1.549 m)     Head Cir --      Peak Flow --      Pain Score 01/24/15 1656 10     Pain Loc --      Pain Edu? --      Excl. in [place]? --      Constitutional: Alert and oriented. Well appearing and in no distress. Eyes: Conjunctivae are normal. PERRL. Normal extraocular movements. ENT   Head: Normocephalic and atraumatic.   Nose: No congestion/rhinnorhea.   Mouth/Throat: Mucous membranes are moist.   Neck: No stridor. Cardiovascular/Chest: Normal rate, regular rhythm.  No murmurs, rubs, or gallops. Respiratory: Normal respiratory effort without tachypnea nor retractions. Breath sounds are clear and equal bilaterally. No wheezes/rales/rhonchi. Gastrointestinal: Soft. No distention, no guarding, no rebound. Nontender  Genitourinary/rectal:Deferred Musculoskeletal: Nontender with normal range of motion in all extremities. No joint effusions.  Mild calf tenderness on the left. Trace edema left lower extremity. No skin rash. Neurologic:  Normal speech and language. No gross or focal neurologic deficits are appreciated. Skin:  Skin is warm, dry and intact. No rash noted. Psychiatric: Mood and affect are normal. Speech and behavior are normal. Patient exhibits appropriate insight and judgment.  ____________________________________________   EKG I, Lisa Roca, MD, the attending physician have personally viewed and interpreted all ECGs.  No EKG performed ____________________________________________  LABS (pertinent  positives/negatives)  None  ____________________________________________  RADIOLOGY All Xrays were viewed by me. Imaging interpreted by Radiologist.  [place] LE left:  No evidence of deep venous thrombosis __________________________________________  PROCEDURES  Procedure(s) performed: None  Critical Care performed: None  ____________________________________________   ED COURSE / ASSESSMENT AND PLAN  CONSULTATIONS: None  Pertinent labs & imaging results that were available during my care of the patient were reviewed by me and considered in my medical decision making (see chart for details).   Mild left calf discomfort with minimal/trace lower extremity edema, with a history of prior right lower extremity DVT, warrants a rule out for DVT in the left.  Ultrasound negative. Unclear etiology, but reassuring physical exam. I am suspecting a musculoskeletal source of discomfort. She should follow up with her primary care physician.  Patient / Family / Caregiver  informed of clinical course, medical decision-making process, and agree with plan.   I discussed return precautions, follow-up instructions, and discharged instructions with patient and/or family.  ___________________________________________   FINAL CLINICAL IMPRESSION(S) / ED DIAGNOSES   Final diagnoses:  Leg pain  Calf pain, left       Lisa Roca, MD 01/24/15 1850

## 2015-03-05 ENCOUNTER — Encounter: Payer: BLUE CROSS/BLUE SHIELD | Admitting: Podiatry

## 2015-04-12 ENCOUNTER — Emergency Department
Admission: EM | Admit: 2015-04-12 | Discharge: 2015-04-12 | Disposition: A | Discharge status: Home / Self Care | Payer: BLUE CROSS/BLUE SHIELD | Attending: Emergency Medicine | Admitting: Emergency Medicine

## 2015-04-12 ENCOUNTER — Encounter: Payer: Self-pay | Admitting: Emergency Medicine

## 2015-04-12 ENCOUNTER — Emergency Department: Payer: BLUE CROSS/BLUE SHIELD

## 2015-04-12 DIAGNOSIS — R110 Nausea: Secondary | ICD-10-CM | POA: Insufficient documentation

## 2015-04-12 DIAGNOSIS — R079 Chest pain, unspecified: Secondary | ICD-10-CM | POA: Diagnosis not present

## 2015-04-12 DIAGNOSIS — Z79899 Other long term (current) drug therapy: Secondary | ICD-10-CM | POA: Insufficient documentation

## 2015-04-12 DIAGNOSIS — Z880 Allergy status to penicillin: Secondary | ICD-10-CM | POA: Insufficient documentation

## 2015-04-12 LAB — COMPREHENSIVE METABOLIC PANEL
ALBUMIN: 4.1 g/dL (ref 3.5–5.0)
ALK PHOS: 126 U/L (ref 38–126)
ALT: 18 U/L (ref 14–54)
AST: 22 U/L (ref 15–41)
Anion gap: 8 (ref 5–15)
BUN: 23 mg/dL — ABNORMAL HIGH (ref 6–20)
CHLORIDE: 103 mmol/L (ref 101–111)
CO2: 28 mmol/L (ref 22–32)
CREATININE: 0.9 mg/dL (ref 0.44–1.00)
Calcium: 9.1 mg/dL (ref 8.9–10.3)
GFR calc non Af Amer: >60 mL/min (ref >60)
Glucose, Bld: 103 mg/dL — ABNORMAL HIGH (ref 65–99)
Potassium: 3.8 mmol/L (ref 3.5–5.1)
SODIUM: 139 mmol/L (ref 135–145)
Total Bilirubin: 0.2 mg/dL — ABNORMAL LOW (ref 0.3–1.2)
Total Protein: 7.3 g/dL (ref 6.5–8.1)

## 2015-04-12 LAB — CBC
HCT: 37.4 % (ref 35.0–47.0)
HEMOGLOBIN: 12.9 g/dL (ref 12.0–16.0)
MCH: 28.3 pg (ref 26.0–34.0)
MCHC: 34.4 g/dL (ref 32.0–36.0)
MCV: 82.1 fL (ref 80.0–100.0)
PLATELETS: 276 10*3/uL (ref 150–440)
RBC: 4.56 MIL/uL (ref 3.80–5.20)
RDW: 13.1 % (ref 11.5–14.5)
WBC: 9.6 10*3/uL (ref 3.6–11.0)

## 2015-04-12 LAB — TROPONIN I: TROPONIN I: 0.03 ng/mL (ref <0.031)

## 2015-04-12 LAB — LIPASE, BLOOD: LIPASE: 27 U/L (ref 11–51)

## 2015-04-12 NOTE — ED Notes (Signed)
States had episode of chest pressure yesterday and returned at 4 am this am. Continues same. Light SOB, nausea this am. Diaphoresis both onsets.

## 2015-04-12 NOTE — Discharge Instructions (Signed)
You were evaluated for intermittent nausea, sweats, and nonspecific chest discomfort, and although no certain cause was found for your symptoms today, your exam and evaluation in the emergency department are reassuring.  We discussed, return to the emergency department for any new or worsening condition having chest pain, trouble breathing, shortness breath, dizziness, passing out, or any other symptoms concerning to you.   Nonspecific Chest Pain  Chest pain can be caused by many different conditions. There is always a chance that your pain could be related to something serious, such as a heart attack or a blood clot in your lungs. Chest pain can also be caused by conditions that are not life-threatening. If you have chest pain, it is very important to follow up with your health care provider. CAUSES  Chest pain can be caused by:  Heartburn.  Pneumonia or bronchitis.  Anxiety or stress.  Inflammation around your heart (pericarditis) or lung (pleuritis or pleurisy).  A blood clot in your lung.  A collapsed lung (pneumothorax). It can develop suddenly on its own (spontaneous pneumothorax) or from trauma to the chest.  Shingles infection (varicella-zoster virus).  Heart attack.  Damage to the bones, muscles, and cartilage that make up your chest wall. This can include:  Bruised bones due to injury.  Strained muscles or cartilage due to frequent or repeated coughing or overwork.  Fracture to one or more ribs.  Sore cartilage due to inflammation (costochondritis). RISK FACTORS  Risk factors for chest pain may include:  Activities that increase your risk for trauma or injury to your chest.  Respiratory infections or conditions that cause frequent coughing.  Medical conditions or overeating that can cause heartburn.  Heart disease or family history of heart disease.  Conditions or health behaviors that increase your risk of developing a blood clot.  Having had chicken pox  (varicella zoster). SIGNS AND SYMPTOMS Chest pain can feel like:  Burning or tingling on the surface of your chest or deep in your chest.  Crushing, pressure, aching, or squeezing pain.  Dull or sharp pain that is worse when you move, cough, or take a deep breath.  Pain that is also felt in your back, neck, shoulder, or arm, or pain that spreads to any of these areas. Your chest pain may come and go, or it may stay constant. DIAGNOSIS Lab tests or other studies may be needed to find the cause of your pain. Your health care provider may have you take a test called an ambulatory ECG (electrocardiogram). An ECG records your heartbeat patterns at the time the test is performed. You may also have other tests, such as:  Transthoracic echocardiogram (TTE). During echocardiography, sound waves are used to create a picture of all of the heart structures and to look at how blood flows through your heart.  Transesophageal echocardiogram (TEE).This is a more advanced imaging test that obtains images from inside your body. It allows your health care provider to see your heart in finer detail.  Cardiac monitoring. This allows your health care provider to monitor your heart rate and rhythm in real time.  Holter monitor. This is a portable device that records your heartbeat and can help to diagnose abnormal heartbeats. It allows your health care provider to track your heart activity for several days, if needed.  Stress tests. These can be done through exercise or by taking medicine that makes your heart beat more quickly.  Blood tests.  Imaging tests. TREATMENT  Your treatment depends on what is  causing your chest pain. Treatment may include:  Medicines. These may include:  Acid blockers for heartburn.  Anti-inflammatory medicine.  Pain medicine for inflammatory conditions.  Antibiotic medicine, if an infection is present.  Medicines to dissolve blood clots.  Medicines to treat coronary  artery disease.  Supportive care for conditions that do not require medicines. This may include:  Resting.  Applying heat or cold packs to injured areas.  Limiting activities until pain decreases. HOME CARE INSTRUCTIONS  If you were prescribed an antibiotic medicine, finish it all even if you start to feel better.  Avoid any activities that bring on chest pain.  Do not use any tobacco products, including cigarettes, chewing tobacco, or electronic cigarettes. If you need help quitting, ask your health care provider.  Do not drink alcohol.  Take medicines only as directed by your health care provider.  Keep all follow-up visits as directed by your health care provider. This is important. This includes any further testing if your chest pain does not go away.  If heartburn is the cause for your chest pain, you may be told to keep your head raised (elevated) while sleeping. This reduces the chance that acid will go from your stomach into your esophagus.  Make lifestyle changes as directed by your health care provider. These may include:  Getting regular exercise. Ask your health care provider to suggest some activities that are safe for you.  Eating a heart-healthy diet. A registered dietitian can help you to learn healthy eating options.  Maintaining a healthy weight.  Managing diabetes, if necessary.  Reducing stress. SEEK MEDICAL CARE IF:  Your chest pain does not go away after treatment.  You have a rash with blisters on your chest.  You have a fever. SEEK IMMEDIATE MEDICAL CARE IF:   Your chest pain is worse.  You have an increasing cough, or you cough up blood.  You have severe abdominal pain.  You have severe weakness.  You faint.  You have chills.  You have sudden, unexplained chest discomfort.  You have sudden, unexplained discomfort in your arms, back, neck, or jaw.  You have shortness of breath at any time.  You suddenly start to sweat, or your  skin gets clammy.  You feel nauseous or you vomit.  You suddenly feel light-headed or dizzy.  Your heart begins to beat quickly, or it feels like it is skipping beats. These symptoms may represent a serious problem that is an emergency. Do not wait to see if the symptoms will go away. Get medical help right away. Call your local emergency services (911 in the [place] Do not drive yourself to the hospital.   This information is not intended to replace advice given to you by your health care provider. Make sure you discuss any questions you have with your health care provider.   Document Released: 11/11/2004 Document Revised: 02/22/2014 Document Reviewed: 09/07/2013 Elsevier Interactive Patient Education [employer].

## 2015-04-12 NOTE — ED Provider Notes (Signed)
[facility] Emergency Department Provider Note   ____________________________________________  Time seen:  I have reviewed the triage vital signs and the triage nursing note.  HISTORY  Chief Complaint Chest Pain   Historian Patient  HPI Janet Phillips is a 65 y.o. female with known coronary artery disease history, nor history of blood sugar problems, who is here because of several episodes of feeling lightheaded with nausea and hot flash associated with chest pressure. Not exertional. The last episode happened this morning when she woke up around 4 AM and passed on its own. She attempted to check her blood sugar with her husband's meal, but they couldn't get blood come out of her finger. She did eat something and drink something and felt a little better. No GERD symptoms reported. No recent illness such as fever, vomiting, diarrhea, or abdominal pain.  No palpitations or trouble breathing.    Past Medical History  Diagnosis Date  . Hiatal hernia   . IBS (irritable bowel syndrome)   . DVT (deep venous thrombosis) [facility])     Patient Active Problem List   Diagnosis Date Noted  . Allergic rhinitis 01/21/2015  . Absolute anemia 01/21/2015  . Anxiety 01/21/2015  . Arthritis 01/21/2015  . Deep vein thrombosis (DVT) ([place]) 01/21/2015  . Acid reflux 01/21/2015  . Personal history of other specified conditions 01/21/2015  . H/O acute myocardial infarction 01/21/2015  . HLD (hyperlipidemia) 01/21/2015  . BP (high blood pressure) 01/21/2015  . 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 Disease of thyroid gland 01/21/2015  . Avitaminosis D 01/21/2015  . Cannot sleep 01/21/2015  . Osteopenia 11/21/2014  . Breast mass, left 10/02/2014  . Infection of the upper respiratory tract 02/13/2014  . Edema leg 09/15/2005    Past Surgical History  Procedure Laterality Date  . Breast biopsy Right 2002    neg/ Dr Bary Castilla  . Ankle surgery Left 2010    torn ligament  . Shoulder  surgery Right 2012    torn ligament  . Hand surgery Bilateral 2014  . Fine needle aspiration Left 10-01-14    benign    Current Outpatient Rx  Name  Route  Sig  Dispense  Refill  . ALPRAZolam (XANAX) 0.5 MG tablet   Oral   Take 0.5 mg by mouth daily.         Marland Kitchen azelastine (ASTELIN) 0.1 % nasal spray      PLACE 1 SPRAY INTO BOTH NOSTRILS 2 (TWO) TIMES DAILY FOR 7 DAYS. THEN TWICE A DAY AS NEEDED      5   . doxycycline (VIBRAMYCIN) 100 MG capsule      TAKE 1 CAPSULE (100 MG TOTAL) BY MOUTH 2 (TWO) TIMES DAILY FOR 7 DAYS.      0   . fluconazole (DIFLUCAN) 150 MG tablet      TAKE 1 TABLET ONCE. MAY REPEAT ONCE IF SYMPTOMS RECUR.      1   . fluticasone (FLONASE) 50 MCG/ACT nasal spray   Each Nare   Place into both nostrils as needed for allergies or rhinitis.         Marland Kitchen guaiFENesin-codeine (ROBITUSSIN AC) 100-10 MG/5ML syrup   Oral   Take by mouth.         . loratadine (CLARITIN) 10 MG tablet   Oral   Take 10 mg by mouth daily as needed for allergies.         . Vitamin D, Ergocalciferol, (DRISDOL) 50000 UNITS CAPS capsule  TAKE 1 CAPSULE (50,000 UNITS TOTAL) BY MOUTH ONCE A WEEK.      3     Allergies Azithromycin; Erythromycin base; Hydrocodone-acetaminophen; Lansoprazole; Levofloxacin in d5w; Prednisone; Amoxicillin; and Cefuroxime axetil  No family history on file.  Social History Social History  Substance Use Topics  . Smoking status: Never Smoker   . Smokeless tobacco: Never Used  . Alcohol Use: No    Review of Systems  Constitutional: Negative for fever. Eyes: Negative for visual changes. ENT: Negative for sore throat. Cardiovascular: Positive for central chest pressure episode lasting only a few minutes. Respiratory: Negative for shortness of breath. Gastrointestinal: Negative for abdominal pain, vomiting and diarrhea. Genitourinary: Negative for dysuria. Musculoskeletal: Negative for back pain. Skin: Negative for  rash. Neurological: Negative for headache. 10 point Review of Systems otherwise negative ____________________________________________   PHYSICAL EXAM:  VITAL SIGNS: ED Triage Vitals  Enc Vitals Group     BP 04/12/15 1244 147/67 mmHg     Pulse Rate 04/12/15 1244 96     Resp 04/12/15 1244 18     Temp 04/12/15 1244 97.8 F (36.6 C)     Temp Source 04/12/15 1244 Oral     SpO2 04/12/15 1244 98 %     Weight 04/12/15 1244 180 lb (81.647 kg)     Height 04/12/15 1244 5\' 1"  (1.549 m)     Head Cir --      Peak Flow --      Pain Score 04/12/15 1245 5     Pain Loc --      Pain Edu? --      Excl. in [place]? --      Constitutional: Alert and oriented. Well appearing and in no distress. HEENT   Head: Normocephalic and atraumatic.      Eyes: Conjunctivae are normal. PERRL. Normal extraocular movements.      Ears:         Nose: No congestion/rhinnorhea.   Mouth/Throat: Mucous membranes are moist.   Neck: No stridor. Cardiovascular/Chest: Normal rate, regular rhythm.  No murmurs, rubs, or gallops. Respiratory: Normal respiratory effort without tachypnea nor retractions. Breath sounds are clear and equal bilaterally. No wheezes/rales/rhonchi. Gastrointestinal: Soft. No distention, no guarding, no rebound. Nontender.    Genitourinary/rectal:Deferred Musculoskeletal: Nontender with normal range of motion in all extremities. No joint effusions.  No lower extremity tenderness.  No edema. Neurologic:  Normal speech and language. No gross or focal neurologic deficits are appreciated. Skin:  Skin is warm, dry and intact. No rash noted. Psychiatric: Mood and affect are normal. Speech and behavior are normal. Patient exhibits appropriate insight and judgment.  ____________________________________________   EKG I, Lisa Roca, MD, the attending physician have personally viewed and interpreted all ECGs.  80 bpm. Normal sinus rhythm with sinus arrhythmia. Narrow QRS. Normal axis. Normal ST  and T-wave ____________________________________________  LABS (pertinent positives/negatives)  CBC within normal limits Comprehensive metabolic panel within normal limits Troponin 0.03 Lipase 27  ____________________________________________  RADIOLOGY All Xrays were viewed by me. Imaging interpreted by Radiologist.  Chest 1 view: No active disease __________________________________________  PROCEDURES  Procedure(s) performed: None  Critical Care performed: None  ____________________________________________   ED COURSE / ASSESSMENT AND PLAN  Pertinent labs & imaging results that were available during my care of the patient were reviewed by me and considered in my medical decision making (see chart for details).   Patient had episode of atypical central chest pressure, but she really complains more about the lightheadedness/hot flash type sensation  that she thought might have been low blood sugar.  Her vitals, physical exam, and laboratory evaluation are all reassuring. I think be unusual if this was cardiac related, however I will have her follow-up with her cardiologist Dr. Ubaldo Glassing which hasn't been for several years.  No neurologic symptoms.    CONSULTATIONS:   None   Patient / Family / Caregiver informed of clinical course, medical decision-making process, and agree with plan.   I discussed return precautions, follow-up instructions, and discharged instructions with patient and/or family.   ___________________________________________   FINAL CLINICAL IMPRESSION(S) / ED DIAGNOSES   Final diagnoses:  Nausea  Chest pain, unspecified              Note: This dictation was prepared with Dragon dictation. Any transcriptional errors that result from this process are unintentional   Lisa Roca, MD 04/12/15 1435

## 2015-06-10 ENCOUNTER — Inpatient Hospital Stay: Payer: BLUE CROSS/BLUE SHIELD | Admitting: Hematology and Oncology

## 2015-06-10 ENCOUNTER — Other Ambulatory Visit: Payer: Self-pay | Admitting: *Deleted

## 2015-06-10 ENCOUNTER — Inpatient Hospital Stay: Payer: BLUE CROSS/BLUE SHIELD

## 2015-06-10 DIAGNOSIS — D509 Iron deficiency anemia, unspecified: Secondary | ICD-10-CM

## 2015-06-27 ENCOUNTER — Inpatient Hospital Stay: Payer: BLUE CROSS/BLUE SHIELD | Admitting: Hematology and Oncology

## 2015-06-27 ENCOUNTER — Inpatient Hospital Stay: Payer: BLUE CROSS/BLUE SHIELD

## 2015-06-27 ENCOUNTER — Telehealth: Payer: Self-pay | Admitting: Hematology and Oncology

## 2015-06-27 NOTE — Telephone Encounter (Signed)
Patient wants to check with her insurance company to see how much it will cost before she comes again. She will call us to reschedule if/when she is able. Thanks.

## 2015-06-27 NOTE — Telephone Encounter (Signed)
Just let us know if she wants appt and put her on. thanks

## 2015-08-28 ENCOUNTER — Telehealth: Payer: Self-pay

## 2015-08-28 NOTE — Telephone Encounter (Signed)
Patient wants to make appointment with Dr. Florian Buff.  Transferred call to scheduleing

## 2015-08-29 ENCOUNTER — Inpatient Hospital Stay: Payer: BLUE CROSS/BLUE SHIELD | Attending: Hematology and Oncology | Admitting: Hematology and Oncology

## 2015-08-29 ENCOUNTER — Other Ambulatory Visit: Payer: Self-pay | Admitting: *Deleted

## 2015-08-29 ENCOUNTER — Inpatient Hospital Stay: Payer: BLUE CROSS/BLUE SHIELD

## 2015-08-29 VITALS — BP 148/76 | HR 80 | Temp 97.2°F | Resp 18 | Ht 62.0 in | Wt 204.6 lb

## 2015-08-29 DIAGNOSIS — D509 Iron deficiency anemia, unspecified: Secondary | ICD-10-CM

## 2015-08-29 DIAGNOSIS — Z79899 Other long term (current) drug therapy: Secondary | ICD-10-CM | POA: Diagnosis not present

## 2015-08-29 DIAGNOSIS — R42 Dizziness and giddiness: Secondary | ICD-10-CM | POA: Diagnosis not present

## 2015-08-29 DIAGNOSIS — R0602 Shortness of breath: Secondary | ICD-10-CM | POA: Insufficient documentation

## 2015-08-29 DIAGNOSIS — K449 Diaphragmatic hernia without obstruction or gangrene: Secondary | ICD-10-CM | POA: Diagnosis not present

## 2015-08-29 DIAGNOSIS — R5383 Other fatigue: Secondary | ICD-10-CM | POA: Diagnosis not present

## 2015-08-29 DIAGNOSIS — N63 Unspecified lump in breast: Secondary | ICD-10-CM | POA: Insufficient documentation

## 2015-08-29 DIAGNOSIS — Z86718 Personal history of other venous thrombosis and embolism: Secondary | ICD-10-CM | POA: Insufficient documentation

## 2015-08-29 DIAGNOSIS — K589 Irritable bowel syndrome without diarrhea: Secondary | ICD-10-CM | POA: Diagnosis not present

## 2015-08-29 DIAGNOSIS — R531 Weakness: Secondary | ICD-10-CM | POA: Diagnosis not present

## 2015-08-29 DIAGNOSIS — D649 Anemia, unspecified: Secondary | ICD-10-CM

## 2015-08-29 DIAGNOSIS — R5382 Chronic fatigue, unspecified: Secondary | ICD-10-CM

## 2015-08-29 LAB — CBC WITH DIFFERENTIAL/PLATELET
Basophils Absolute: 0.1 10*3/uL (ref 0–0.1)
Basophils Relative: 1 %
Eosinophils Absolute: 0.3 10*3/uL (ref 0–0.7)
Eosinophils Relative: 4 %
HCT: 39.8 % (ref 35.0–47.0)
Hemoglobin: 13.3 g/dL (ref 12.0–16.0)
Lymphocytes Relative: 24 %
Lymphs Abs: 2.1 10*3/uL (ref 1.0–3.6)
MCH: 27.5 pg (ref 26.0–34.0)
MCHC: 33.5 g/dL (ref 32.0–36.0)
MCV: 82.2 fL (ref 80.0–100.0)
Monocytes Absolute: 0.6 10*3/uL (ref 0.2–0.9)
Monocytes Relative: 6 %
Neutro Abs: 5.7 10*3/uL (ref 1.4–6.5)
Neutrophils Relative %: 65 %
Platelets: 262 10*3/uL (ref 150–440)
RBC: 4.85 MIL/uL (ref 3.80–5.20)
RDW: 13.3 % (ref 11.5–14.5)
WBC: 8.8 10*3/uL (ref 3.6–11.0)

## 2015-08-29 LAB — IRON AND TIBC
Iron: 61 ug/dL (ref 28–170)
Saturation Ratios: 18 % (ref 10.4–31.8)
TIBC: 338 ug/dL (ref 250–450)
UIBC: 277 ug/dL

## 2015-08-29 LAB — FERRITIN: Ferritin: 110 ng/mL (ref 11–307)

## 2015-08-29 NOTE — Progress Notes (Signed)
Patient is here for follow up, last seen in 2015. She has noted she is tired and takes plenty of naps in the day, she notes dizziness and some SOB when waking up. Patient is here for iron check and it dehydrated. They had a hard time drawing blood today.

## 2015-08-29 NOTE — Progress Notes (Signed)
[facility] day:  08/29/2015  Chief Complaint: Janet Phillips is a 65 y.o. female with a history of iron deficiency anemia who is seen for reassessment.  HPI: The patient was last seen in the medical oncology clinic on 06/10/2014.  At that time, she was doing well. She denied any melena or hematochezia. Exam was unremarkable. CBC revealed a hematocrit 39.3, hemoglobin 13, MCV 84, platelets 251,000, white count 7900 with an ANC of 5100. Ferritin was 148.  The patient was to follow-up with labs in 6 months and then assessment in one year.  No labs are available. She returns today secondary to fatigue. He is unclear if it's related to her blood loss simply running her son around as he is 100% disabled. Time she feels weak and dizzy headed up. Sometimes she is a little short of breath. She has some epigastric discomfort.  Patient notes a "stomach flare" for which she saw Dr. Vira Agar.  She was felt to have gastritis caused by antibiotics and prednisone.  She was treated with a PPI and Carafate.  She is on Nexium twice a day.  She also notes a history of a left breast lump with breast pain when lying on her side. She saw her gynecologist, Dr Debby Bud Dalen.  Mammogram on 09/30/2014 revealed an increase in size of a left breast nodule.  Ultrasound on 09/27/2014 revealed a 1.1 cm complicated cyst in the 5-6 o'clock position of the left breast with features most consistent with an infected cyst with surrounding edema.  She was placed on clindamycin.  She was referred to Dr. Bary Castilla.  Aspiration of the cyst on 10/02/2014 revealed no evidence of malignancy.  She notes a history of spell being weak and "dizzy headed". She felt like she was going to pass out.  She notes a little shortness of breath. She has not noticed an irregular rhythm.  B12 level was 553 on 08/25/2015.  She notes being on a weight loss program.  She lost 40 pounds. Weight has recently increased  with eating carbohydrates secondary to her stomach.   Past Medical History  Diagnosis Date  . Hiatal hernia   . IBS (irritable bowel syndrome)   . DVT (deep venous thrombosis) [facility])     Past Surgical History  Procedure Laterality Date  . Breast biopsy Right 2002    neg/ Dr Bary Castilla  . Ankle surgery Left 2010    torn ligament  . Shoulder surgery Right 2012    torn ligament  . Hand surgery Bilateral 2014  . Fine needle aspiration Left 10-01-14    benign    History reviewed. No pertinent family history.  Social History:  reports that she has never smoked. She has never used smokeless tobacco. She reports that she does not drink alcohol or use illicit drugs.  Her son is 100% disabled.  The patient is alone today.  Allergies:  Allergies  Allergen Reactions  . Azithromycin   . Beclomethasone Other (See Comments)  . Erythromycin Base Nausea And Vomiting  . Hydrocodone-Acetaminophen Nausea Only and Nausea And Vomiting  . Lansoprazole Nausea And Vomiting  . Levofloxacin In D5w Nausea Only  . Prednisone Swelling  . Amoxicillin Rash  . Cefuroxime Axetil Rash    Current Medications: Current Outpatient Prescriptions  Medication Sig Dispense Refill  . ALPRAZolam (XANAX) 0.5 MG tablet Take 0.5 mg by mouth daily.    Marland Kitchen esomeprazole (NEXIUM) 40 MG capsule Take 40 mg by mouth  2 (two) times daily.     . fluticasone (FLONASE) 50 MCG/ACT nasal spray Place into both nostrils as needed for allergies or rhinitis.    Marland Kitchen loratadine (CLARITIN) 10 MG tablet Take 10 mg by mouth daily as needed for allergies.     No current facility-administered medications for this visit.    Review of Systems:  GENERAL:  Fatigue.  Sweats in AM.  No fevers.  Weight gain. PERFORMANCE STATUS (ECOG):  1 HEENT:  No visual changes, runny nose, sore throat, mouth sores or tenderness. Lungs: Little shortness of breath.  No cough.  No hemoptysis. Cardiac:  No chest pain, palpitations, orthopnea, or PND. GI:  Trying  to eat less.  Epigastric discomfort.  No nausea, vomiting, diarrhea, constipation, melena or hematochezia.  Guaiac cards pending. GU:  No urgency, frequency, dysuria, or hematuria. Musculoskeletal:  No back pain.  No joint pain.  No muscle tenderness. Extremities:  No pain or swelling. Skin:  No rashes or skin changes. Neuro:  Weak and dizzy headed at times.  No headache, numbness or weakness, balance or coordination issues. Endocrine:  No diabetes, thyroid issues, hot flashes or night sweats. Psych:  No mood changes, depression or anxiety. Pain:  No focal pain. Review of systems:  All other systems reviewed and found to be negative.  Physical Exam: Blood pressure 148/76, pulse 80, temperature 97.2 F (36.2 C), temperature source Tympanic, resp. rate 18, height 5\' 2"  (1.575 m), weight 204 lb 9.4 oz (92.8 kg). GENERAL:  Well developed, well nourished, woman sitting comfortably in the exam room in no acute distress. MENTAL STATUS:  Alert and oriented to person, place and time. HEAD:  Long brown hair.  Normocephalic, atraumatic, face symmetric, no Cushingoid features. EYES:  Brown eyes.  Pupils equal round and reactive to light and accomodation.  No conjunctivitis or scleral icterus. NECK:  Prominent pulse in the region of her suprasternal notch.  Right carotid bruit. ENT:  Oropharynx clear without lesion.  Tongue normal. Mucous membranes moist.  RESPIRATORY:  Clear to auscultation without rales, wheezes or rhonchi. CARDIOVASCULAR:  Regular rate and rhythm without murmur, rub or gallop. ABDOMEN:  Soft, non-tender, with active bowel sounds, and no hepatosplenomegaly.  No masses. SKIN:  No rashes, ulcers or lesions. EXTREMITIES: Slight ankle edema.  No skin discoloration or tenderness.  No palpable cords. LYMPH NODES: No palpable cervical, supraclavicular, axillary or inguinal adenopathy  NEUROLOGICAL: Unremarkable. PSYCH:  Appropriate.   Appointment on 08/29/2015  Component Date Value Ref  Range Status  . WBC 08/29/2015 8.8  3.6 - 11.0 K/uL Final  . RBC 08/29/2015 4.85  3.80 - 5.20 MIL/uL Final  . Hemoglobin 08/29/2015 13.3  12.0 - 16.0 g/dL Final  . HCT 08/29/2015 39.8  35.0 - 47.0 % Final  . MCV 08/29/2015 82.2  80.0 - 100.0 fL Final  . MCH 08/29/2015 27.5  26.0 - 34.0 pg Final  . MCHC 08/29/2015 33.5  32.0 - 36.0 g/dL Final  . RDW 08/29/2015 13.3  11.5 - 14.5 % Final  . Platelets 08/29/2015 262  150 - 440 K/uL Final  . Neutrophils Relative % 08/29/2015 65   Final  . Neutro Abs 08/29/2015 5.7  1.4 - 6.5 K/uL Final  . Lymphocytes Relative 08/29/2015 24   Final  . Lymphs Abs 08/29/2015 2.1  1.0 - 3.6 K/uL Final  . Monocytes Relative 08/29/2015 6   Final  . Monocytes Absolute 08/29/2015 0.6  0.2 - 0.9 K/uL Final  . Eosinophils Relative 08/29/2015 4  Final  . Eosinophils Absolute 08/29/2015 0.3  0 - 0.7 K/uL Final  . Basophils Relative 08/29/2015 1   Final  . Basophils Absolute 08/29/2015 0.1  0 - 0.1 K/uL Final  . Ferritin 08/29/2015 110  11 - 307 ng/mL Final  . Iron 08/29/2015 61  28 - 170 ug/dL Final  . TIBC 08/29/2015 338  250 - 450 ug/dL Final  . Saturation Ratios 08/29/2015 18  10.4 - 31.8 % Final  . UIBC 08/29/2015 277   Final    Assessment:  Janet Phillips is a 65 y.o. female with a history of iron deficiency anemia. She is intolerant to oral iron. She received Ferraheme in the past (09/07/2012 and 09/14/2012). She had an EGD in 01/31/2012 (no report available). Colonoscopy on 03/07/2008 revealed a hyperplastic polyp.  She has a history of a right lower shin a DVT in 2008.  She has a history of a left breast lump.  Mammogram on 09/30/2014 revealed an increase in size of a left breast nodule.  Ultrasound on 09/27/2014 revealed a 1.1 cm complicated cyst in the 5-6 o'clock position of the left breast.  Aspiration on 10/02/2014 revealed no evidence of malignancy.  She has a recent history of fatigue which may be related to taking care of her disabled son.  She has  episodic weakness and dizziness.  Exam reveals a prominent pulse in the region of the suprasternal notch.  She has a right carotid bruit.  Plan: 1.  Labs today:  CBC with diff, ferritin, iron studies. 2.  Review events over past 15 months.  Discuss normal hematocrit and MCV.  She has no anemia to account for her fatigue.  Discuss follow-up with her PCP and her cardiologist. 3.  RTC in 6 months for labs (CBC with diff, ferritin). 4.  RTC in 1 year for MD assess and labs (CBC with diff, ferritin, and iron studies).   Lequita Asal, MD  08/29/2015, 9:37 AM

## 2015-08-30 ENCOUNTER — Encounter: Payer: Self-pay | Admitting: Hematology and Oncology

## 2015-09-24 ENCOUNTER — Encounter: Payer: Self-pay | Admitting: Hematology and Oncology

## 2015-09-24 DIAGNOSIS — D509 Iron deficiency anemia, unspecified: Secondary | ICD-10-CM | POA: Insufficient documentation

## 2015-11-26 ENCOUNTER — Other Ambulatory Visit: Payer: Self-pay | Admitting: Obstetrics & Gynecology

## 2015-11-26 DIAGNOSIS — Z1231 Encounter for screening mammogram for malignant neoplasm of breast: Secondary | ICD-10-CM

## 2015-12-22 ENCOUNTER — Ambulatory Visit
Admission: RE | Admit: 2015-12-22 | Discharge: 2015-12-22 | Disposition: A | Discharge status: Home / Self Care | Payer: BLUE CROSS/BLUE SHIELD | Source: Ambulatory Visit | Attending: Obstetrics & Gynecology | Admitting: Obstetrics & Gynecology

## 2015-12-22 DIAGNOSIS — Z1231 Encounter for screening mammogram for malignant neoplasm of breast: Secondary | ICD-10-CM | POA: Diagnosis not present

## 2015-12-23 ENCOUNTER — Encounter (INDEPENDENT_AMBULATORY_CARE_PROVIDER_SITE_OTHER): Payer: Self-pay | Admitting: Vascular Surgery

## 2015-12-23 ENCOUNTER — Ambulatory Visit (INDEPENDENT_AMBULATORY_CARE_PROVIDER_SITE_OTHER): Payer: BLUE CROSS/BLUE SHIELD | Admitting: Vascular Surgery

## 2015-12-23 VITALS — BP 153/86 | HR 101 | Resp 18 | Ht 61.0 in | Wt 202.0 lb

## 2015-12-23 DIAGNOSIS — M7989 Other specified soft tissue disorders: Secondary | ICD-10-CM | POA: Insufficient documentation

## 2015-12-23 DIAGNOSIS — I890 Lymphedema, not elsewhere classified: Secondary | ICD-10-CM | POA: Diagnosis not present

## 2015-12-23 DIAGNOSIS — I10 Essential (primary) hypertension: Secondary | ICD-10-CM | POA: Diagnosis not present

## 2015-12-23 DIAGNOSIS — E785 Hyperlipidemia, unspecified: Secondary | ICD-10-CM

## 2015-12-23 NOTE — Assessment & Plan Note (Signed)
Control is reasonably good with compression stockings and leg elevation. The lymphedema pump has been in excellent adjuvant therapy and has made her legs feel better and have less swelling. Continue current therapy. At this point, can follow-up on an as-needed basis.

## 2015-12-23 NOTE — Assessment & Plan Note (Signed)
lipid control important in reducing the progression of atherosclerotic disease. Continue statin therapy if her side effects are not prohibitive.

## 2015-12-23 NOTE — Progress Notes (Signed)
MRN : [id number]  Janet Phillips is a 65 y.o. (1950/09/03) female who presents with chief complaint of  Chief Complaint  Patient presents with  . Follow-up  .  History of Present Illness: Patient returns in follow up for Leg swelling and lymphedema. The addition of the lymphedema pump has been very helpful to her. Her legs are hurting less and her swelling at this point is quite well-controlled. She and her primary care physician or trying to keep her on Crestor although she is taking a low dose, but some leg pain and side effects may prohibit this long-term.    Past Medical History:  Diagnosis Date  . DVT (deep venous thrombosis) ([place])   . Hiatal hernia   . IBS (irritable bowel syndrome)     Past Surgical History:  Procedure Laterality Date  . ANKLE SURGERY Left 2010   torn ligament  . BREAST BIOPSY Right 2002   neg/ Dr Bary Castilla  . BREAST CYST ASPIRATION Left 10/02/2014   cyst asp byrnett 6:00 4cmfn  . FINE NEEDLE ASPIRATION Left 10-01-14   benign  . HAND SURGERY Bilateral 2014  . SHOULDER SURGERY Right 2012   torn ligament    Social History Social History  Substance Use Topics  . Smoking status: Never Smoker  . Smokeless tobacco: Never Used  . Alcohol use No    Family History Mo bleeding or clotting disorders  Current Outpatient Prescriptions  Medication Sig Dispense Refill  . ALPRAZolam (XANAX) 0.5 MG tablet Take 0.5 mg by mouth daily.    Marland Kitchen esomeprazole (NEXIUM) 40 MG capsule Take 40 mg by mouth 2 (two) times daily.     . fluticasone (FLONASE) 50 MCG/ACT nasal spray Place into both nostrils as needed for allergies or rhinitis.    Marland Kitchen loratadine (CLARITIN) 10 MG tablet Take 10 mg by mouth daily as needed for allergies.    . rosuvastatin (CRESTOR) 5 MG tablet Take 5 mg by mouth daily.     No current facility-administered medications for this visit.     Allergies  Allergen Reactions  . Azithromycin   . Beclomethasone Other (See Comments)  . Erythromycin  Base Nausea And Vomiting  . Hydrocodone-Acetaminophen Nausea Only and Nausea And Vomiting  . Lansoprazole Nausea And Vomiting  . Levofloxacin In D5w Nausea Only  . Prednisone Swelling  . Amoxicillin Rash  . Cefuroxime Axetil Rash     REVIEW OF SYSTEMS (Negative unless checked)  Constitutional: [] Weight loss  [] Fever  [] Chills Cardiac: [] Chest pain   [] Chest pressure   [] Palpitations   [] Shortness of breath when laying flat   [] Shortness of breath at rest   [] Shortness of breath with exertion. Vascular:  [] Pain in legs with walking   [x] Pain in legs at rest   [] Pain in legs when laying flat   [] Claudication   [] Pain in feet when walking  [] Pain in feet at rest  [] Pain in feet when laying flat   [] History of DVT   [] Phlebitis   [x] Swelling in legs   [] Varicose veins   [] Non-healing ulcers Pulmonary:   [] Uses home oxygen   [] Productive cough   [] Hemoptysis   [] Wheeze  [] COPD    Neurologic:  [] Dizziness  [] Blackouts   [] Seizures   [] History of stroke   [] History of TIA  [] Aphasia   [] Temporary blindness   [] Dysphagia   [] Weakness or numbness in arms   [] Weakness or numbness in legs Musculoskeletal:  [] Arthritis   [] Joint swelling   [] Joint pain   []   Low back pain Hematologic:  [] Easy bruising  [] Easy bleeding   [] Hypercoagulable state   [] Anemic  [] Thrombocytopenia Gastrointestinal:  [] Blood in stool   [] Vomiting blood  [] Gastroesophageal reflux/heartburn   [] Difficulty swallowing. Genitourinary:  [] Chronic kidney disease   [] Difficult urination  [] Frequent urination  [] Burning with urination   [] Blood in urine Skin:  [] Rashes   [] Ulcers   [] Wounds Psychological:  [] History of anxiety   []  History of major depression.  Physical Examination  Vitals:   12/23/15 1310  BP: (!) 153/86  Pulse: (!) 101  Resp: 18  Weight: 202 lb (91.6 kg)  Height: 5\' 1"  (1.549 m)   Body mass index is 38.17 kg/m. Gen:  WD/WN, NAD, appears younger than stated age Head: [place]/AT, No temporalis  wasting. Ear/Nose/Throat: Hearing grossly intact, trachea midline Eyes: Conjunctiva clear. Sclera non-icteric Neck: Supple.  No JVD.  Pulmonary:  Good air movement, respirations not labored, no use of accessory muscles.  Cardiac: RRR, normal S1, S2. Vascular:  Vessel Right Left  Radial Palpable Palpable                                   Gastrointestinal: soft, non-tender/non-distended. No guarding/reflex.  Musculoskeletal: M/S 5/5 throughout.  No deformity or atrophy. Trace RLE, 1+ LLE edema. Neurologic: Sensation grossly intact in extremities.  Symmetrical.  Speech is fluent. Psychiatric: Judgment intact, Mood & affect appropriate for pt's clinical situation. Dermatologic: No rashes or ulcers noted.  No cellulitis or open wounds. Lymph : No Cervical, Axillary, or Inguinal lymphadenopathy.     Labs No results found for this or any previous visit (from the past 2160 hour(s)).  Radiology No results found.    Assessment/Plan  BP (high blood pressure) blood pressure control important in reducing the progression of atherosclerotic disease. On appropriate oral medications.   HLD (hyperlipidemia) lipid control important in reducing the progression of atherosclerotic disease. Continue statin therapy if her side effects are not prohibitive.   Swelling of limb Control is reasonably good with compression stockings and leg elevation. The lymphedema pump has been in excellent adjuvant therapy and has made her legs feel better and have less swelling. Continue current therapy. At this point, can follow-up on an as-needed basis.  Lymphedema Control is reasonably good with compression stockings and leg elevation. The lymphedema pump has been in excellent adjuvant therapy and has made her legs feel better and have less swelling. Continue current therapy. At this point, can follow-up on an as-needed basis.    Leotis Pain, MD  12/23/2015 2:11 PM    This note was created with  Dragon medical transcription system.  Any errors from dictation are purely unintentional

## 2015-12-23 NOTE — Assessment & Plan Note (Signed)
blood pressure control important in reducing the progression of atherosclerotic disease. On appropriate oral medications.  

## 2016-02-27 ENCOUNTER — Other Ambulatory Visit: Payer: BLUE CROSS/BLUE SHIELD

## 2016-03-08 ENCOUNTER — Emergency Department
Admission: EM | Admit: 2016-03-08 | Discharge: 2016-03-08 | Disposition: A | Discharge status: Home / Self Care | Payer: BLUE CROSS/BLUE SHIELD | Attending: Emergency Medicine | Admitting: Emergency Medicine

## 2016-03-08 ENCOUNTER — Emergency Department: Payer: BLUE CROSS/BLUE SHIELD

## 2016-03-08 ENCOUNTER — Encounter: Payer: Self-pay | Admitting: Emergency Medicine

## 2016-03-08 DIAGNOSIS — R0789 Other chest pain: Secondary | ICD-10-CM | POA: Diagnosis not present

## 2016-03-08 LAB — BASIC METABOLIC PANEL
ANION GAP: 9 (ref 5–15)
BUN: 18 mg/dL (ref 6–20)
CHLORIDE: 105 mmol/L (ref 101–111)
CO2: 24 mmol/L (ref 22–32)
Calcium: 9.2 mg/dL (ref 8.9–10.3)
Creatinine, Ser: 0.71 mg/dL (ref 0.44–1.00)
GFR calc Af Amer: >60 mL/min (ref >60)
Glucose, Bld: 115 mg/dL — ABNORMAL HIGH (ref 65–99)
POTASSIUM: 3.5 mmol/L (ref 3.5–5.1)
SODIUM: 138 mmol/L (ref 135–145)

## 2016-03-08 LAB — CBC
HCT: 38.3 % (ref 35.0–47.0)
HEMOGLOBIN: 13 g/dL (ref 12.0–16.0)
MCH: 27.7 pg (ref 26.0–34.0)
MCHC: 34 g/dL (ref 32.0–36.0)
MCV: 81.5 fL (ref 80.0–100.0)
Platelets: 325 10*3/uL (ref 150–440)
RBC: 4.69 MIL/uL (ref 3.80–5.20)
RDW: 13.8 % (ref 11.5–14.5)
WBC: 12.4 10*3/uL — AB (ref 3.6–11.0)

## 2016-03-08 LAB — TROPONIN I
Troponin I: <0.03 ng/mL (ref <0.03)
Troponin I: <0.03 ng/mL (ref <0.03)

## 2016-03-08 LAB — HEPATIC FUNCTION PANEL
ALBUMIN: 4.4 g/dL (ref 3.5–5.0)
ALK PHOS: 131 U/L — AB (ref 38–126)
ALT: 17 U/L (ref 14–54)
AST: 25 U/L (ref 15–41)
BILIRUBIN TOTAL: 0.6 mg/dL (ref 0.3–1.2)
Bilirubin, Direct: <0.1 mg/dL — ABNORMAL LOW (ref 0.1–0.5)
TOTAL PROTEIN: 8 g/dL (ref 6.5–8.1)

## 2016-03-08 LAB — LIPASE, BLOOD: Lipase: 19 U/L (ref 11–51)

## 2016-03-08 LAB — FIBRIN DERIVATIVES D-DIMER (ARMC ONLY): Fibrin derivatives D-dimer (ARMC): 426 (ref 0–499)

## 2016-03-08 MED ORDER — GI COCKTAIL ~~LOC~~
30.0000 mL | Freq: Once | ORAL | Status: AC
  Administered 2016-03-08: 30 mL via ORAL
  Filled 2016-03-08: qty 30

## 2016-03-08 MED ORDER — SUCRALFATE 1 G PO TABS
1.0000 g | ORAL_TABLET | Freq: Four times a day (QID) | ORAL | 1 refills | Status: DC
Start: 2016-03-08 — End: 2017-07-05

## 2016-03-08 MED ORDER — SUCRALFATE 1 G PO TABS
1.0000 g | ORAL_TABLET | Freq: Once | ORAL | Status: AC
  Administered 2016-03-08: 1 g via ORAL
  Filled 2016-03-08: qty 1

## 2016-03-08 NOTE — Discharge Instructions (Signed)
Please return immediately if condition worsens. Please contact her primary physician or the physician you were given for referral. If you have any specialist physicians involved in her treatment and plan please also contact them. Thank you for using [facility]. ° °

## 2016-03-08 NOTE — ED Triage Notes (Signed)
C/O mid chest pain for one week.  Pain goes to mid back.  States pain is intermittent and returned this morning.

## 2016-03-08 NOTE — ED Provider Notes (Signed)
Time Seen: Approximately 2004  I have reviewed the triage notes  Chief Complaint: Chest Pain   History of Present Illness: Janet Phillips is a 66 y.o. female *who has a previous history of deep venous thrombosis and a hiatal hernia along with irritable bowel syndrome. The patient was in to see her primary physician and was diagnosed with pharyngitis. She states she had some sore throat initially and some mild gas and reflux symptoms. She states that they did not do a strep test but per her on doxycycline. She states since she's been on the antibiotic she's had increased reflux-type symptoms, epigastric abdominal pain with occasional radiation of pain into the back region. She denies any melena or hematochezia no persistent vomiting. She denies any new leg pain or swelling. He has no cardiac history. She was evaluated for chest pain in the past and apparently received a diagnostic heart catheterization without stent placement.   Past Medical History:  Diagnosis Date  . DVT (deep venous thrombosis) ([place])   . Hiatal hernia   . IBS (irritable bowel syndrome)     Patient Active Problem List   Diagnosis Date Noted  . Swelling of limb 12/23/2015  . Lymphedema 12/23/2015  . Iron deficiency anemia 09/24/2015  . Allergic rhinitis 01/21/2015  . Absolute anemia 01/21/2015  . Anxiety 01/21/2015  . Arthritis 01/21/2015  . Deep vein thrombosis (DVT) ([place]) 01/21/2015  . Acid reflux 01/21/2015  . Personal history of other specified conditions 01/21/2015  . H/O acute myocardial infarction 01/21/2015  . HLD (hyperlipidemia) 01/21/2015  . BP (high blood pressure) 01/21/2015  . 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 Disease of thyroid gland 01/21/2015  . Avitaminosis D 01/21/2015  . Cannot sleep 01/21/2015  . Osteopenia 11/21/2014  . Breast mass, left 10/02/2014  . Infection of the upper respiratory tract 02/13/2014  . Edema leg 09/15/2005    Past Surgical History:  Procedure Laterality Date  .  ANKLE SURGERY Left 2010   torn ligament  . BREAST BIOPSY Right 2002   neg/ Dr Bary Castilla  . BREAST CYST ASPIRATION Left 10/02/2014   cyst asp byrnett 6:00 4cmfn  . FINE NEEDLE ASPIRATION Left 10-01-14   benign  . HAND SURGERY Bilateral 2014  . SHOULDER SURGERY Right 2012   torn ligament    Past Surgical History:  Procedure Laterality Date  . ANKLE SURGERY Left 2010   torn ligament  . BREAST BIOPSY Right 2002   neg/ Dr Bary Castilla  . BREAST CYST ASPIRATION Left 10/02/2014   cyst asp byrnett 6:00 4cmfn  . FINE NEEDLE ASPIRATION Left 10-01-14   benign  . HAND SURGERY Bilateral 2014  . SHOULDER SURGERY Right 2012   torn ligament    Current Outpatient Rx  . Order #: [id number] Class: Historical Med  . Order #: [id number] Class: Historical Med  . Order #: [id number] Class: Historical Med  . Order #: [id number] Class: Historical Med  . Order #: [id number] Class: Historical Med  . Order #: [id number] Class: Print    Allergies:  Azithromycin; Beclomethasone; Erythromycin base; Hydrocodone-acetaminophen; Lansoprazole; Levofloxacin in d5w; Prednisone; Amoxicillin; and Cefuroxime axetil  Family History: No family history on file.  Social History: Social History  Substance Use Topics  . Smoking status: Never Smoker  . Smokeless tobacco: Never Used  . Alcohol use No     Review of Systems:   10 point review of systems was performed and was otherwise negative:  Constitutional: No fever Eyes: No visual disturbances ENT: No sore throat, ear pain CardiacChest pain is  been intermittent now for the past week and she describes a substernal burning with epigastric pain and pain radiating to the back. She denies any arm or jaw discomfort. Respiratory: No shortness of breath, wheezing, or stridor Abdomen: No abdominal pain, no vomiting, No diarrhea Endocrine: No weight loss, No night sweats Extremities: No peripheral edema, cyanosis Skin: No rashes, easy bruising Neurologic: No focal weakness,  trouble with speech or swollowing Urologic: No dysuria, Hematuria, or urinary frequency   Physical Exam:  ED Triage Vitals  Enc Vitals Group     BP 03/08/16 1309 (!) 182/77     Pulse Rate 03/08/16 1309 (!) 110     Resp 03/08/16 1309 16     Temp 03/08/16 1309 97.8 F (36.6 C)     Temp Source 03/08/16 1309 Oral     SpO2 03/08/16 1309 96 %     Weight 03/08/16 1308 204 lb (92.5 kg)     Height 03/08/16 1308 5\' 2"  (1.575 m)     Head Circumference --      Peak Flow --      Pain Score 03/08/16 1308 10     Pain Loc --      Pain Edu? --      Excl. in [place]? --     General: Awake , Alert , and Oriented times 3; GCS 15 Anxious Head: Normal cephalic , atraumatic Eyes: Pupils equal , round, reactive to light Nose/Throat: No nasal drainage, patent upper airway without erythema or exudate.  Neck: Supple, Full range of motion, No anterior adenopathy or palpable thyroid masses Lungs: Clear to ascultation without wheezes , rhonchi, or rales Heart: Regular rate, regular rhythm without murmurs , gallops , or rubs Abdomen: Patient has mild epigastric abdominal pain to deep palpation without any peritoneal signs. No organosplenomegaly. .        Extremities: 2 plus symmetric pulses. No edema, clubbing or cyanosis Neurologic: normal ambulation, Motor symmetric without deficits, sensory intact Skin: warm, dry, no rashes   Labs:   All laboratory work was reviewed including any pertinent negatives or positives listed below:  Labs Reviewed  BASIC METABOLIC PANEL - Abnormal; Notable for the following:       Result Value   Glucose, Bld 115 (*)    All other components within normal limits  CBC - Abnormal; Notable for the following:    WBC 12.4 (*)    All other components within normal limits  HEPATIC FUNCTION PANEL - Abnormal; Notable for the following:    Alkaline Phosphatase 131 (*)    Bilirubin, Direct <0.1 (*)    All other components within normal limits  TROPONIN I  LIPASE, BLOOD  TROPONIN  I  FIBRIN DERIVATIVES D-DIMER ([facility] ONLY)  D-dimer test was negative, serial troponins were negative  EKG: * ED ECG REPORT I, Daymon Larsen, the attending physician, personally viewed and interpreted this ECG.  Date: 03/08/2016 EKG Time: 1305Rate: 119 Rhythm: normal sinus rhythm QRS Axis: normal Intervals: normal ST/T Wave abnormalities: normal Conduction Disturbances: none Narrative Interpretation: unremarkable No acute ischemic changes. Poor R-wave progression in the anterior leads  Radiology:  "Dg Chest 2 View  Result Date: 03/08/2016 CLINICAL DATA:  Mid chest pain over the last week. EXAM: CHEST  2 VIEW COMPARISON:  04/12/2015 FINDINGS: Heart size is normal. There is some aortic calcification. Pulmonary vascularity is normal. Lungs are clear. No effusions. No significant bone finding. Previous right shoulder surgery. IMPRESSION: No active disease.  Aortic atherosclerosis. Electronically Signed  By: Nelson Chimes M.D.   On: 03/08/2016 14:13  "  I personally reviewed the radiologic studies    ED Course:  Patient received a GI cocktail with symptomatic improvement. She does not have any indications of tonsillitis. Patient I felt had irritation of her GI tract secondary to the doxycycline a most likely had some reflux prior to initiation of the antibiotic. The patient's otherwise hemodynamically stable and recheck of her pulse rate is show gradual decrease with. I felt she was tachycardic secondary to anxiety and discomfort. Patient had serial troponins with a one-week history of intermittent chest pain and felt again this was unlikely to be unstable angina or acute coronary syndrome.     Assessment:  Esophageal reflux disease   Final Clinical Impression:   Final diagnoses:  Atypical chest pain     Plan: * Outpatient " New Prescriptions   SUCRALFATE (CARAFATE) 1 G TABLET    Take 1 tablet (1 g total) by mouth 4 (four) times daily.  " Patient was advised to return  immediately if condition worsens. Patient was advised to follow up with their primary care physician or other specialized physicians involved in their outpatient care. The patient and/or family member/power of attorney had laboratory results reviewed at the bedside. All questions and concerns were addressed and appropriate discharge instructions were distributed by the nursing staff.             Daymon Larsen, MD 03/08/16 [phone]

## 2016-08-11 ENCOUNTER — Encounter: Payer: Self-pay | Admitting: Obstetrics and Gynecology

## 2016-08-11 ENCOUNTER — Ambulatory Visit (INDEPENDENT_AMBULATORY_CARE_PROVIDER_SITE_OTHER): Payer: BLUE CROSS/BLUE SHIELD | Admitting: Obstetrics and Gynecology

## 2016-08-11 VITALS — BP 146/68 | HR 95 | Wt 203.0 lb

## 2016-08-11 DIAGNOSIS — L900 Lichen sclerosus et atrophicus: Secondary | ICD-10-CM | POA: Diagnosis not present

## 2016-08-11 MED ORDER — CLOBETASOL PROPIONATE 0.05 % EX OINT: TOPICAL_OINTMENT | CUTANEOUS | 5 refills | Status: DC

## 2016-08-11 NOTE — Progress Notes (Signed)
Obstetrics & Gynecology Office Visit   Chief Complaint:  Chief Complaint  Patient presents with  . Vaginitis    vaginal itching/pressure/pain  Monistat did not help    History of Present Illness: [name] is a 66 y.o. G2P0 who LMP was No LMP recorded. Patient is postmenopausal., presents today for a problem visit.   Patient complains of an abnormal vaginal irritation and itching for 2 weeks. Discharge described as: normal and physiologic. Vaginal symptoms include local irritation and vulvar itching.Vulvar symptoms include local irritation and vulvar itching.STI Risk: Very low risk of STD exposure.   Other associated symptoms: none.Menstrual pattern: She had not been experiencing any associated bleeding. She denies recent antibiotic exposure, denies changes in soaps, detergents coinciding with the onset of her symptoms.  She has not previously self treated or been under treatment by another provider for these symptoms, she self treated with monistat.  She also does not report significant SUI symptoms that may be contributing to her irritation.   Review of Systems: 10 point review of systems negative unless otherwise noted in HPI  Past Medical History:  Past Medical History:  Diagnosis Date  . DVT (deep venous thrombosis) ([place])   . Hiatal hernia   . IBS (irritable bowel syndrome)     Past Surgical History:  Past Surgical History:  Procedure Laterality Date  . ANKLE SURGERY Left 2010   torn ligament  . BREAST BIOPSY Right 2002   neg/ Dr Bary Castilla  . BREAST CYST ASPIRATION Left 10/02/2014   cyst asp byrnett 6:00 4cmfn  . FINE NEEDLE ASPIRATION Left 10-01-14   benign  . HAND SURGERY Bilateral 2014  . SHOULDER SURGERY Right 2012   torn ligament    Gynecologic History: No LMP recorded. Patient is postmenopausal.  Obstetric History: G2P0  Family History:  History reviewed. No pertinent family history.  Social History:  Social History   Social History  . Marital  status: Married    Spouse name: N/A  . Number of children: N/A  . Years of education: N/A   Occupational History  . Not on file.   Social History Main Topics  . Smoking status: Never Smoker  . Smokeless tobacco: Never Used  . Alcohol use No  . Drug use: No  . Sexual activity: No   Other Topics Concern  . Not on file   Social History Narrative  . No narrative on file    Allergies:  Allergies  Allergen Reactions  . Azithromycin   . Beclomethasone Other (See Comments)  . Erythromycin Base Nausea And Vomiting  . Hydrocodone-Acetaminophen Nausea Only and Nausea And Vomiting  . Lansoprazole Nausea And Vomiting  . Levofloxacin In D5w Nausea Only  . Prednisone Swelling  . Amoxicillin Rash  . Cefuroxime Axetil Rash    Medications: Prior to Admission medications   Medication Sig Start Date End Date Taking? Authorizing Provider  ALPRAZolam Duanne Moron) 0.5 MG tablet Take 0.5 mg by mouth daily.   Yes [provider]  esomeprazole (NEXIUM) 40 MG capsule Take 40 mg by mouth 2 (two) times daily.  08/21/15  Yes [provider]  fluticasone (FLONASE) 50 MCG/ACT nasal spray Place into both nostrils as needed for allergies or rhinitis.   Yes [provider]  hydrochlorothiazide (HYDRODIURIL) 12.5 MG tablet TAKE 1 TABLET (12.5 MG TOTAL) BY MOUTH ONCE DAILY. 07/22/16  Yes [provider]  loratadine (CLARITIN) 10 MG tablet Take 10 mg by mouth daily as needed for allergies.  Yes [provider]  sucralfate (CARAFATE) 1 g tablet Take 1 tablet (1 g total) by mouth 4 (four) times daily. 03/08/16 03/08/17 Yes Daymon Larsen, MD    Physical Exam Vitals:  Vitals:   08/11/16 0942  BP: (!) 146/68  Pulse: 95   No LMP recorded. Patient is postmenopausal.  General: NAD HEENT: normocephalic, anicteric Pulmonary: No increased work of breathing  Genitourinary:  External: Changes consistent with lichen sclerosis noted, hypopigmented parchment like skin,  loss of definition in the labia minora, changes slightly more pronounce around clitoral hood.  Normal urethral meatus, normal Bartholin's and Skene's glands.    Vagina: Normal vaginal mucosa, no evidence of prolapse.   Extremities: no edema, erythema, or tenderness Neurologic: Grossly intact Psychiatric: mood appropriate, affect full  Female chaperone present for pelvic portions of the physical exam  Physical Exam  Genitourinary:        Assessment: 66 y.o. G2P0 No problem-specific Assessment & Plan notes found for this encounter.   Plan: Problem List Items Addressed This Visit    None    Visit Diagnoses    Lichen sclerosus    -  Primary      1) Changes seen most consistent with lichen sclerosis - Trial of clobetasol, if no improvement will obtain vulvar biopsy.  There are some atrophy changes as well and we discussed adding vaginal premarin if mild residual symptoms and improved on exam next visit.    2) A total of 15 minutes were spent in face-to-face contact with the patient during this encounter with over half of that time devoted to counseling and coordination of care.

## 2016-08-27 ENCOUNTER — Ambulatory Visit: Payer: BLUE CROSS/BLUE SHIELD | Admitting: Hematology and Oncology

## 2016-08-27 ENCOUNTER — Other Ambulatory Visit: Payer: BLUE CROSS/BLUE SHIELD

## 2016-09-03 ENCOUNTER — Other Ambulatory Visit: Payer: Self-pay | Admitting: *Deleted

## 2016-09-03 DIAGNOSIS — D508 Other iron deficiency anemias: Secondary | ICD-10-CM

## 2016-09-05 NOTE — Progress Notes (Signed)
[facility] day:  09/06/2016   Chief Complaint: Janet Phillips is a 66 y.o. female with a history of iron deficiency anemia who is seen for 1 year assessment.  HPI: The patient was last seen in the medical oncology clinic on 08/29/2015.  At that time, she noted a recent history of fatigue which was felt possibly related to taking care of her disabled son.  She had episodic weakness and dizziness. She was to follow-up with her primary care physician and/or cardiologist.  She states the pulsating area in her neck was "checked out".  CBC on 08/29/2015 revealed a hematocrit 39.8, hemoglobin 13.3, MCV 82.2, platelets 262,000, white count 8800 with an ANC of 7700. Ferritin was 110. Iron saturation was 18%.  CBC on 03/08/2016 revealed a hematocrit 38.3, hemoglobin 13.0 and MCV 81.5.  During the interim, she has had a lot of stress in her life.  There have been 5 deaths in the family (3 with cancer; 2 with surgeries that went wrong).  She feels tired.  She has no energy.     Past Medical History:  Diagnosis Date  . DVT (deep venous thrombosis) ([place])   . Hiatal hernia   . IBS (irritable bowel syndrome)     Past Surgical History:  Procedure Laterality Date  . ANKLE SURGERY Left 2010   torn ligament  . BREAST BIOPSY Right 2002   neg/ Dr Bary Castilla  . BREAST CYST ASPIRATION Left 10/02/2014   cyst asp byrnett 6:00 4cmfn  . FINE NEEDLE ASPIRATION Left 10-01-14   benign  . HAND SURGERY Bilateral 2014  . SHOULDER SURGERY Right 2012   torn ligament    History reviewed. No pertinent family history.  Social History:  reports that she has never smoked. She has never used smokeless tobacco. She reports that she does not drink alcohol or use drugs.  Her son is 100% disabled.  She lives in [place].  The patient is alone today.  Allergies:  Allergies  Allergen Reactions  . Azithromycin   . Beclomethasone Other (See Comments)  . Doxycycline Nausea Only   . Erythromycin Base Nausea And Vomiting  . Hydrocodone-Acetaminophen Nausea Only and Nausea And Vomiting  . Lansoprazole Nausea And Vomiting  . Levofloxacin In D5w Nausea Only  . Prednisone Swelling  . Sulfa Antibiotics Other (See Comments)  . Amoxicillin Rash  . Cefuroxime Axetil Rash    Current Medications: Current Outpatient Prescriptions  Medication Sig Dispense Refill  . ALPRAZolam (XANAX) 0.5 MG tablet Take 0.5 mg by mouth daily.    . clobetasol ointment (TEMOVATE) 0.05 % Apply to affected area every night for 4 weeks, then every other day for 4 weeks and then twice a week for 4 weeks or until resolution. 30 g 5  . esomeprazole (NEXIUM) 40 MG capsule Take 40 mg by mouth 2 (two) times daily.     . fluticasone (FLONASE) 50 MCG/ACT nasal spray Place into both nostrils as needed for allergies or rhinitis.    . hydrochlorothiazide (HYDRODIURIL) 12.5 MG tablet TAKE 1 TABLET (12.5 MG TOTAL) BY MOUTH ONCE DAILY.  11  . loratadine (CLARITIN) 10 MG tablet Take 10 mg by mouth daily as needed for allergies.    Marland Kitchen sucralfate (CARAFATE) 1 g tablet Take 1 tablet (1 g total) by mouth 4 (four) times daily. 120 tablet 1   No current facility-administered medications for this visit.     Review of Systems:  GENERAL:  Fatigue. No fevers  or sweats.  Weight loss of 7 pounds. PERFORMANCE STATUS (ECOG):  1 HEENT:  No visual changes, runny nose, sore throat, mouth sores or tenderness. Lungs:  No shortness of breath.  No cough.  No hemoptysis. Cardiac:  No chest pain, palpitations, orthopnea, or PND. GI:  No nausea, vomiting, diarrhea, constipation, melena or hematochezia.  Guaiac cards pending. GU:  No urgency, frequency, dysuria, or hematuria. Musculoskeletal:  No back pain.  No joint pain.  No muscle tenderness. Extremities:  No pain or swelling. Skin:  No rashes or skin changes. Neuro:  No headache, numbness or weakness, balance or coordination issues. Endocrine:  No diabetes, thyroid issues, hot  flashes or night sweats. Psych:  Stress.  No mood changes, depression or anxiety. Pain:  No focal pain. Review of systems:  All other systems reviewed and found to be negative.  Physical Exam: Blood pressure 140/81, pulse 79, temperature (!) 97.5 F (36.4 C), temperature source Tympanic, resp. rate 18, weight 197 lb 5 oz (89.5 kg). GENERAL:  Well developed, well nourished, woman sitting comfortably in the exam room in no acute distress. MENTAL STATUS:  Alert and oriented to person, place and time. HEAD:  Long brown hair.  Normocephalic, atraumatic, face symmetric, no Cushingoid features. EYES:  Brown eyes.  Pupils equal round and reactive to light and accomodation.  No conjunctivitis or scleral icterus. NECK:  Prominent pulse in the region of her suprasternal notch.  Right carotid bruit. ENT:  Oropharynx clear without lesion.  Tongue normal. Mucous membranes moist.  RESPIRATORY:  Clear to auscultation without rales, wheezes or rhonchi. CARDIOVASCULAR:  Regular rate and rhythm without murmur, rub or gallop. ABDOMEN:  Soft, non-tender, with active bowel sounds, and no hepatosplenomegaly.  No masses. SKIN:  No rashes, ulcers or lesions. EXTREMITIES: Large legs.  No skin discoloration or tenderness.  No palpable cords. LYMPH NODES: No palpable cervical, supraclavicular, axillary or inguinal adenopathy  NEUROLOGICAL: Unremarkable. PSYCH:  Appropriate.   Appointment on 09/06/2016  Component Date Value Ref Range Status  . WBC 09/06/2016 9.9  3.6 - 11.0 K/uL Final  . RBC 09/06/2016 4.74  3.80 - 5.20 MIL/uL Final  . Hemoglobin 09/06/2016 13.0  12.0 - 16.0 g/dL Final  . HCT 09/06/2016 38.4  35.0 - 47.0 % Final  . MCV 09/06/2016 80.9  80.0 - 100.0 fL Final  . MCH 09/06/2016 27.4  26.0 - 34.0 pg Final  . MCHC 09/06/2016 33.9  32.0 - 36.0 g/dL Final  . RDW 09/06/2016 13.8  11.5 - 14.5 % Final  . Platelets 09/06/2016 293  150 - 440 K/uL Final  . Neutrophils Relative % 09/06/2016 66  % Final  .  Neutro Abs 09/06/2016 6.4  1.4 - 6.5 K/uL Final  . Lymphocytes Relative 09/06/2016 23  % Final  . Lymphs Abs 09/06/2016 2.3  1.0 - 3.6 K/uL Final  . Monocytes Relative 09/06/2016 7  % Final  . Monocytes Absolute 09/06/2016 0.7  0.2 - 0.9 K/uL Final  . Eosinophils Relative 09/06/2016 3  % Final  . Eosinophils Absolute 09/06/2016 0.3  0 - 0.7 K/uL Final  . Basophils Relative 09/06/2016 1  % Final  . Basophils Absolute 09/06/2016 0.1  0 - 0.1 K/uL Final  . Ferritin 09/06/2016 88  11 - 307 ng/mL Final  . Iron 09/06/2016 62  28 - 170 ug/dL Final  . TIBC 09/06/2016 345  250 - 450 ug/dL Final  . Saturation Ratios 09/06/2016 18  10.4 - 31.8 % Final  . UIBC 09/06/2016  283  ug/dL Final    Assessment:  Janet Phillips is a 66 y.o. female with a history of iron deficiency anemia. She is intolerant of oral iron. She received Ferraheme in the past (09/07/2012 and 09/14/2012).   She had an EGD in 01/31/2012 (no report available). Colonoscopy on 03/07/2008 revealed a hyperplastic polyp.  She has a history of a right lower extremity DVT.  Duplex on 04/20/2006 revealed nonocclusive thrombus in the proximal superficial femoral vein and proximal common femoral vein.  She has a history of a left breast lump.  Mammogram on 09/30/2014 revealed an increase in size of a left breast nodule.  Ultrasound on 09/27/2014 revealed a 1.1 cm complicated cyst in the 5-6 o'clock position of the left breast.  Aspiration on 10/02/2014 revealed no evidence of malignancy.  She has had significant personal stressors in the recent past.  She is fatigued.  Hematocrit, hemoglobin, and MCV are normal.  Ferritin is normal.  Plan: 1.  Labs today:  CBC with diff, ferritin, iron studies. 2.  Review labs.  She has a normal hematocrit and MCV.  We discussed follow-up on an as needed basis.  3.  RTC prn.   Lequita Asal, MD  09/06/2016, 11:46 AM

## 2016-09-06 ENCOUNTER — Inpatient Hospital Stay (HOSPITAL_BASED_OUTPATIENT_CLINIC_OR_DEPARTMENT_OTHER): Payer: BLUE CROSS/BLUE SHIELD | Admitting: Hematology and Oncology

## 2016-09-06 ENCOUNTER — Inpatient Hospital Stay: Payer: BLUE CROSS/BLUE SHIELD | Attending: Hematology and Oncology

## 2016-09-06 ENCOUNTER — Encounter: Payer: Self-pay | Admitting: Hematology and Oncology

## 2016-09-06 VITALS — BP 140/81 | HR 79 | Temp 97.5°F | Resp 18 | Wt 197.3 lb

## 2016-09-06 DIAGNOSIS — R5383 Other fatigue: Secondary | ICD-10-CM | POA: Insufficient documentation

## 2016-09-06 DIAGNOSIS — Z79899 Other long term (current) drug therapy: Secondary | ICD-10-CM | POA: Diagnosis not present

## 2016-09-06 DIAGNOSIS — N6320 Unspecified lump in the left breast, unspecified quadrant: Secondary | ICD-10-CM | POA: Diagnosis not present

## 2016-09-06 DIAGNOSIS — D509 Iron deficiency anemia, unspecified: Secondary | ICD-10-CM | POA: Insufficient documentation

## 2016-09-06 DIAGNOSIS — Z86718 Personal history of other venous thrombosis and embolism: Secondary | ICD-10-CM | POA: Diagnosis not present

## 2016-09-06 DIAGNOSIS — K449 Diaphragmatic hernia without obstruction or gangrene: Secondary | ICD-10-CM | POA: Diagnosis not present

## 2016-09-06 DIAGNOSIS — K589 Irritable bowel syndrome without diarrhea: Secondary | ICD-10-CM | POA: Diagnosis not present

## 2016-09-06 DIAGNOSIS — D508 Other iron deficiency anemias: Secondary | ICD-10-CM

## 2016-09-06 LAB — CBC WITH DIFFERENTIAL/PLATELET
Basophils Absolute: 0.1 10*3/uL (ref 0–0.1)
Basophils Relative: 1 %
Eosinophils Absolute: 0.3 10*3/uL (ref 0–0.7)
Eosinophils Relative: 3 %
HCT: 38.4 % (ref 35.0–47.0)
Hemoglobin: 13 g/dL (ref 12.0–16.0)
Lymphocytes Relative: 23 %
Lymphs Abs: 2.3 10*3/uL (ref 1.0–3.6)
MCH: 27.4 pg (ref 26.0–34.0)
MCHC: 33.9 g/dL (ref 32.0–36.0)
MCV: 80.9 fL (ref 80.0–100.0)
Monocytes Absolute: 0.7 10*3/uL (ref 0.2–0.9)
Monocytes Relative: 7 %
Neutro Abs: 6.4 10*3/uL (ref 1.4–6.5)
Neutrophils Relative %: 66 %
Platelets: 293 10*3/uL (ref 150–440)
RBC: 4.74 MIL/uL (ref 3.80–5.20)
RDW: 13.8 % (ref 11.5–14.5)
WBC: 9.9 10*3/uL (ref 3.6–11.0)

## 2016-09-06 LAB — IRON AND TIBC
Iron: 62 ug/dL (ref 28–170)
Saturation Ratios: 18 % (ref 10.4–31.8)
TIBC: 345 ug/dL (ref 250–450)
UIBC: 283 ug/dL

## 2016-09-06 LAB — FERRITIN: Ferritin: 88 ng/mL (ref 11–307)

## 2016-09-06 NOTE — Progress Notes (Signed)
Patient states she feels weak and tired.  She has been under a lot of stress with her son who has had multiple health problems.

## 2016-09-22 ENCOUNTER — Ambulatory Visit: Payer: BLUE CROSS/BLUE SHIELD | Admitting: Obstetrics and Gynecology

## 2016-10-06 ENCOUNTER — Other Ambulatory Visit: Payer: Self-pay | Admitting: Family Medicine

## 2016-10-06 DIAGNOSIS — R531 Weakness: Secondary | ICD-10-CM

## 2016-10-06 DIAGNOSIS — R42 Dizziness and giddiness: Secondary | ICD-10-CM

## 2016-10-08 ENCOUNTER — Ambulatory Visit
Admission: RE | Admit: 2016-10-08 | Discharge: 2016-10-08 | Disposition: A | Discharge status: Home / Self Care | Payer: Medicare Other | Source: Ambulatory Visit | Attending: Family Medicine | Admitting: Family Medicine

## 2016-10-08 DIAGNOSIS — R42 Dizziness and giddiness: Secondary | ICD-10-CM | POA: Diagnosis present

## 2016-10-08 DIAGNOSIS — R531 Weakness: Secondary | ICD-10-CM

## 2016-10-08 DIAGNOSIS — I10 Essential (primary) hypertension: Secondary | ICD-10-CM | POA: Diagnosis present

## 2016-11-17 ENCOUNTER — Ambulatory Visit: Payer: BLUE CROSS/BLUE SHIELD | Admitting: Obstetrics & Gynecology

## 2016-11-18 ENCOUNTER — Ambulatory Visit
Admission: RE | Admit: 2016-11-18 | Discharge: 2016-11-18 | Disposition: A | Discharge status: Home / Self Care | Payer: Medicare Other | Source: Ambulatory Visit | Attending: Family Medicine | Admitting: Family Medicine

## 2016-11-18 ENCOUNTER — Other Ambulatory Visit: Payer: Self-pay | Admitting: Family Medicine

## 2016-11-18 DIAGNOSIS — M25571 Pain in right ankle and joints of right foot: Secondary | ICD-10-CM | POA: Insufficient documentation

## 2016-12-24 ENCOUNTER — Ambulatory Visit (INDEPENDENT_AMBULATORY_CARE_PROVIDER_SITE_OTHER): Payer: BLUE CROSS/BLUE SHIELD | Admitting: Vascular Surgery

## 2017-01-11 ENCOUNTER — Encounter (INDEPENDENT_AMBULATORY_CARE_PROVIDER_SITE_OTHER): Payer: Self-pay

## 2017-01-11 ENCOUNTER — Encounter (INDEPENDENT_AMBULATORY_CARE_PROVIDER_SITE_OTHER): Payer: Self-pay | Admitting: Vascular Surgery

## 2017-01-11 ENCOUNTER — Ambulatory Visit (INDEPENDENT_AMBULATORY_CARE_PROVIDER_SITE_OTHER): Payer: Medicare Other | Admitting: Vascular Surgery

## 2017-01-11 VITALS — BP 158/84 | HR 73 | Resp 16 | Ht 61.0 in | Wt 205.0 lb

## 2017-01-11 DIAGNOSIS — I10 Essential (primary) hypertension: Secondary | ICD-10-CM

## 2017-01-11 DIAGNOSIS — I890 Lymphedema, not elsewhere classified: Secondary | ICD-10-CM | POA: Diagnosis not present

## 2017-01-11 DIAGNOSIS — E785 Hyperlipidemia, unspecified: Secondary | ICD-10-CM | POA: Diagnosis not present

## 2017-01-11 DIAGNOSIS — M7989 Other specified soft tissue disorders: Secondary | ICD-10-CM

## 2017-01-11 NOTE — Progress Notes (Signed)
MRN : [id number]  Janet Phillips is a 66 y.o. (1950-08-10) female who presents with chief complaint of  Chief Complaint  Patient presents with  . Follow-up    53yr. no studies  .  History of Present Illness: Patient returns today in follow up of lymphedema and leg swelling.  This is been reasonably stable.  It does come and go and some days are worse she has been wearing her compression stockings and intermittently using her lymphedema pump.  No new ulceration or infection.       Past Medical History:  Diagnosis Date  . DVT (deep venous thrombosis) ([place])   . Hiatal hernia   . IBS (irritable bowel syndrome)          Past Surgical History:  Procedure Laterality Date  . ANKLE SURGERY Left 2010   torn ligament  . BREAST BIOPSY Right 2002   neg/ Dr Bary Castilla  . BREAST CYST ASPIRATION Left 10/02/2014   cyst asp byrnett 6:00 4cmfn  . FINE NEEDLE ASPIRATION Left 10-01-14   benign  . HAND SURGERY Bilateral 2014  . SHOULDER SURGERY Right 2012   torn ligament    Social History     Social History  Substance Use Topics  . Smoking status: Never Smoker  . Smokeless tobacco: Never Used  . Alcohol use No    Family History Mo bleeding or clotting disorders        Current Outpatient Prescriptions  Medication Sig Dispense Refill  . ALPRAZolam (XANAX) 0.5 MG tablet Take 0.5 mg by mouth daily.    Marland Kitchen esomeprazole (NEXIUM) 40 MG capsule Take 40 mg by mouth 2 (two) times daily.     . fluticasone (FLONASE) 50 MCG/ACT nasal spray Place into both nostrils as needed for allergies or rhinitis.    Marland Kitchen loratadine (CLARITIN) 10 MG tablet Take 10 mg by mouth daily as needed for allergies.    . rosuvastatin (CRESTOR) 5 MG tablet Take 5 mg by mouth daily.     No current facility-administered medications for this visit.         Allergies  Allergen Reactions  . Azithromycin   . Beclomethasone Other (See Comments)  . Erythromycin Base Nausea And Vomiting  .  Hydrocodone-Acetaminophen Nausea Only and Nausea And Vomiting  . Lansoprazole Nausea And Vomiting  . Levofloxacin In D5w Nausea Only  . Prednisone Swelling  . Amoxicillin Rash  . Cefuroxime Axetil Rash     REVIEW OF SYSTEMS (Negative unless checked)  Constitutional: [] Weight loss  [] Fever  [] Chills Cardiac: [] Chest pain   [] Chest pressure   [] Palpitations   [] Shortness of breath when laying flat   [] Shortness of breath at rest   [] Shortness of breath with exertion. Vascular:  [] Pain in legs with walking   [x] Pain in legs at rest   [] Pain in legs when laying flat   [] Claudication   [] Pain in feet when walking  [] Pain in feet at rest  [] Pain in feet when laying flat   [] History of DVT   [] Phlebitis   [x] Swelling in legs   [] Varicose veins   [] Non-healing ulcers Pulmonary:   [] Uses home oxygen   [] Productive cough   [] Hemoptysis   [] Wheeze  [] COPD    Neurologic:  [] Dizziness  [] Blackouts   [] Seizures   [] History of stroke   [] History of TIA  [] Aphasia   [] Temporary blindness   [] Dysphagia   [] Weakness or numbness in arms   [] Weakness or numbness in legs Musculoskeletal:  [x] Arthritis   []   Joint swelling   [] Joint pain   [] Low back pain Hematologic:  [] Easy bruising  [] Easy bleeding   [] Hypercoagulable state   [] Anemic  [] Thrombocytopenia Gastrointestinal:  [] Blood in stool   [] Vomiting blood  [] Gastroesophageal reflux/heartburn   [] Difficulty swallowing. Genitourinary:  [] Chronic kidney disease   [] Difficult urination  [] Frequent urination  [] Burning with urination   [] Blood in urine Skin:  [] Rashes   [] Ulcers   [] Wounds Psychological:  [] History of anxiety   []  History of major depression.      Physical Examination  BP (!) 158/84 (BP Location: Right Wrist)   Pulse 73   Resp 16   Ht 5\' 1"  (1.549 m)   Wt 205 lb (93 kg)   BMI 38.73 kg/m  Gen:  WD/WN, NAD Head: [place]/AT, No temporalis wasting. Ear/Nose/Throat: Hearing grossly intact, nares w/o erythema or drainage, trachea  midline Eyes: Conjunctiva clear. Sclera non-icteric Neck: Supple.  No JVD.  Pulmonary:  Good air movement, no use of accessory muscles.  Cardiac: RRR Vascular: scattered varicosities bilaterally Vessel Right Left  Radial Palpable Palpable                          PT Palpable Palpable  DP Palpable Palpable    Musculoskeletal: M/S 5/5 throughout.  No deformity or atrophy. 1+ BLE edema. Neurologic: Sensation grossly intact in extremities.  Symmetrical.  Speech is fluent.  Psychiatric: Judgment intact, Mood & affect appropriate for pt's clinical situation. Dermatologic: No rashes or ulcers noted.  No cellulitis or open wounds.       Labs No results found for this or any previous visit (from the past 2160 hour(s)).  Radiology No results found.   Assessment/Plan BP (high blood pressure) blood pressure control important in reducing the progression of atherosclerotic disease. On appropriate oral medications.   HLD (hyperlipidemia) lipid control important in reducing the progression of atherosclerotic disease. Continue statin therapy if her side effects are not prohibitive.   Swelling of limb Control is reasonably good with compression stockings and leg elevation. The lymphedema pump has been in excellent adjuvant therapy and has made her legs feel better and have less swelling. Continue current therapy.   Lymphedema Control is reasonably good with compression stockings and leg elevation. The lymphedema pump has been in excellent adjuvant therapy and has made her legs feel better and have less swelling. Continue current therapy. At this point, can follow-up annually or sooner if problems develop      Leotis Pain, MD  01/11/2017 5:11 PM    This note was created with Dragon medical transcription system.  Any errors from dictation are purely unintentional

## 2017-01-11 NOTE — Patient Instructions (Signed)

## 2017-02-25 ENCOUNTER — Other Ambulatory Visit: Payer: Self-pay | Admitting: Internal Medicine

## 2017-02-25 DIAGNOSIS — Z1231 Encounter for screening mammogram for malignant neoplasm of breast: Secondary | ICD-10-CM

## 2017-07-05 ENCOUNTER — Emergency Department: Payer: Medicare Other

## 2017-07-05 ENCOUNTER — Emergency Department
Admission: EM | Admit: 2017-07-05 | Discharge: 2017-07-05 | Disposition: A | Discharge status: Home / Self Care | Payer: Medicare Other | Attending: Emergency Medicine | Admitting: Emergency Medicine

## 2017-07-05 ENCOUNTER — Encounter: Payer: Self-pay | Admitting: Emergency Medicine

## 2017-07-05 ENCOUNTER — Other Ambulatory Visit: Payer: Self-pay

## 2017-07-05 DIAGNOSIS — R079 Chest pain, unspecified: Secondary | ICD-10-CM | POA: Diagnosis present

## 2017-07-05 DIAGNOSIS — Z79899 Other long term (current) drug therapy: Secondary | ICD-10-CM | POA: Diagnosis not present

## 2017-07-05 LAB — TROPONIN I
Troponin I: <0.03 ng/mL (ref <0.03)
Troponin I: <0.03 ng/mL (ref <0.03)

## 2017-07-05 LAB — BASIC METABOLIC PANEL
Anion gap: 11 (ref 5–15)
BUN: 19 mg/dL (ref 6–20)
CALCIUM: 9.6 mg/dL (ref 8.9–10.3)
CO2: 24 mmol/L (ref 22–32)
CREATININE: 0.79 mg/dL (ref 0.44–1.00)
Chloride: 103 mmol/L (ref 101–111)
GFR calc Af Amer: >60 mL/min (ref >60)
GLUCOSE: 136 mg/dL — AB (ref 65–99)
Potassium: 3.4 mmol/L — ABNORMAL LOW (ref 3.5–5.1)
SODIUM: 138 mmol/L (ref 135–145)

## 2017-07-05 LAB — CBC
HCT: 39.3 % (ref 35.0–47.0)
HEMOGLOBIN: 13.5 g/dL (ref 12.0–16.0)
MCH: 28 pg (ref 26.0–34.0)
MCHC: 34.3 g/dL (ref 32.0–36.0)
MCV: 81.8 fL (ref 80.0–100.0)
Platelets: 266 10*3/uL (ref 150–440)
RBC: 4.81 MIL/uL (ref 3.80–5.20)
RDW: 13.5 % (ref 11.5–14.5)
WBC: 9.1 10*3/uL (ref 3.6–11.0)

## 2017-07-05 MED ORDER — ALUM & MAG HYDROXIDE-SIMETH 200-200-20 MG/5ML PO SUSP
30.0000 mL | Freq: Once | ORAL | Status: AC
  Administered 2017-07-05: 30 mL via ORAL
  Filled 2017-07-05: qty 30

## 2017-07-05 MED ORDER — GI COCKTAIL ~~LOC~~
30.0000 mL | Freq: Once | ORAL | Status: AC
  Administered 2017-07-05: 30 mL via ORAL
  Filled 2017-07-05: qty 30

## 2017-07-05 NOTE — ED Triage Notes (Signed)
Pt presents to ED via [facility] from home c/o epigastric pain radiating to mid chest and up into throat. Pt states feels like acid reflux. Reports she took robitussin earlier today for cold symptoms and thinks that is what triggered pain. Denies SOB.

## 2017-07-05 NOTE — ED Provider Notes (Signed)
[facility] Emergency Department Provider Note   ____________________________________________   I have reviewed the triage vital signs and the nursing notes.   HISTORY  Chief Complaint Chest Pain   History limited by: Not Limited   HPI Janet Phillips is a 67 y.o. female who presents to the emergency department today because of concerns for chest pain.  Is located in the central chest.  It does radiate up into her throat.  She describes it as burning. It did start shortly after the patient took some robitussin for a cough. States she did lie down afterwards. The patient has not had any fevers recently.   Per medical record review patient has a history of anxiety, hiatal hernia.   Past Medical History:  Diagnosis Date  . Anxiety   . DVT (deep venous thrombosis) ([place])   . Hiatal hernia   . Hyperlipemia   . IBS (irritable bowel syndrome)     Patient Active Problem List   Diagnosis Date Noted  . Swelling of limb 12/23/2015  . Lymphedema 12/23/2015  . Iron deficiency anemia 09/24/2015  . Allergic rhinitis 01/21/2015  . Absolute anemia 01/21/2015  . Anxiety 01/21/2015  . Arthritis 01/21/2015  . Deep vein thrombosis (DVT) ([place]) 01/21/2015  . Acid reflux 01/21/2015  . Personal history of other specified conditions 01/21/2015  . H/O acute myocardial infarction 01/21/2015  . HLD (hyperlipidemia) 01/21/2015  . BP (high blood pressure) 01/21/2015  . 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 Disease of thyroid gland 01/21/2015  . Avitaminosis D 01/21/2015  . Cannot sleep 01/21/2015  . Osteopenia 11/21/2014  . Breast mass, left 10/02/2014  . Infection of the upper respiratory tract 02/13/2014  . Edema leg 09/15/2005    Past Surgical History:  Procedure Laterality Date  . ANKLE SURGERY Left 2010   torn ligament  . BREAST BIOPSY Right 2002   neg/ Dr Bary Castilla  . BREAST CYST ASPIRATION Left 10/02/2014   cyst asp byrnett 6:00 4cmfn  . FINE NEEDLE ASPIRATION  Left 10-01-14   benign  . HAND SURGERY Bilateral 2014  . SHOULDER SURGERY Right 2012   torn ligament    Prior to Admission medications   Medication Sig Start Date End Date Taking? Authorizing Provider  ALPRAZolam Duanne Moron) 0.5 MG tablet Take 0.5 mg by mouth daily.    [provider]  clobetasol ointment (TEMOVATE) 0.05 % Apply to affected area every night for 4 weeks, then every other day for 4 weeks and then twice a week for 4 weeks or until resolution. 08/11/16   Malachy Mood, MD  esomeprazole (NEXIUM) 40 MG capsule Take 40 mg by mouth 2 (two) times daily.  08/21/15   [provider]  fluticasone (FLONASE) 50 MCG/ACT nasal spray Place into both nostrils as needed for allergies or rhinitis.    [provider]  hydrochlorothiazide (HYDRODIURIL) 12.5 MG tablet TAKE 1 TABLET (12.5 MG TOTAL) BY MOUTH ONCE DAILY. 07/22/16   [provider]  loratadine (CLARITIN) 10 MG tablet Take 10 mg by mouth daily as needed for allergies.    [provider]  sucralfate (CARAFATE) 1 g tablet Take 1 tablet (1 g total) by mouth 4 (four) times daily. 03/08/16 03/08/17  Daymon Larsen, MD    Allergies Azithromycin; Beclomethasone; Doxycycline; Erythromycin base; Hydrocodone-acetaminophen; Lansoprazole; Levofloxacin in d5w; Prednisone; Sulfa antibiotics; Amoxicillin; and Cefuroxime axetil  Family History  Problem Relation Age of Onset  . Pancreatic cancer Mother   . Diabetes Father   . Hypertension Father   .  Diabetes Sister     Social History Social History   Tobacco Use  . Smoking status: Never Smoker  . Smokeless tobacco: Never Used  Substance Use Topics  . Alcohol use: No    Alcohol/week: 0.0 oz  . Drug use: No    Review of Systems Constitutional: No fever/chills Eyes: No visual changes. ENT: No sore throat. Cardiovascular: Positive for chest pain. Respiratory: Positive for cough. Gastrointestinal: No abdominal pain.  No nausea, no vomiting.  No  diarrhea.   Genitourinary: Negative for dysuria. Musculoskeletal: Negative for back pain. Skin: Negative for rash. Neurological: Negative for headaches, focal weakness or numbness.  ____________________________________________   PHYSICAL EXAM:  VITAL SIGNS: ED Triage Vitals  Enc Vitals Group     BP 07/05/17 1350 (!) 187/83     Pulse Rate 07/05/17 1350 (!) 103     Resp 07/05/17 1350 14     Temp 07/05/17 1350 98.6 F (37 C)     Temp Source 07/05/17 1350 Oral     SpO2 07/05/17 1350 97 %     Weight 07/05/17 1351 205 lb (93 kg)     Height 07/05/17 1351 5\' 1"  (1.549 m)   Constitutional: Alert and oriented. Well appearing and in no distress. Eyes: Conjunctivae are normal.  ENT   Head: Normocephalic and atraumatic.   Nose: No congestion/rhinnorhea.   Mouth/Throat: Mucous membranes are moist.   Neck: No stridor. Hematological/Lymphatic/Immunilogical: No cervical lymphadenopathy. Cardiovascular: Normal rate, regular rhythm.  No murmurs, rubs, or gallops.  Respiratory: Normal respiratory effort without tachypnea nor retractions. Breath sounds are clear and equal bilaterally. No wheezes/rales/rhonchi. Gastrointestinal: Soft and non tender. No rebound. No guarding.  Genitourinary: Deferred Musculoskeletal: Normal range of motion in all extremities. No lower extremity edema. Neurologic:  Normal speech and language. No gross focal neurologic deficits are appreciated.  Skin:  Skin is warm, dry and intact. No rash noted. Psychiatric: Mood and affect are normal. Speech and behavior are normal. Patient exhibits appropriate insight and judgment.  ____________________________________________    LABS (pertinent positives/negatives)  Trop <0.03 CBC wnl BMP wnl except k 3.4, glu 136  ____________________________________________   EKG  I, Nance Pear, attending physician, personally viewed and interpreted this EKG  EKG Time: 1357 Rate: 89 Rhythm: sinus rhythm Axis:  normal Intervals: qtc 434 QRS: narrow ST changes: no st elevation Impression: normal ekg   ____________________________________________    RADIOLOGY  CXR No acute disease  ____________________________________________   PROCEDURES  Procedures  ____________________________________________   INITIAL IMPRESSION / ASSESSMENT AND PLAN / ED COURSE  Pertinent labs & imaging results that were available during my care of the patient were reviewed by me and considered in my medical decision making (see chart for details).  Patient presented for chest pain and burning. Differential would be broad including esophagitis, esophageal spasm, ACS, pericarditis, PE, PTX, PNA among other etiologies. Work up without concerning findings. Doubt PE or dissection given clinical history and exam. Patient had some relief after gi cocktail. Will however plan on checking second troponin.   ____________________________________________   FINAL CLINICAL IMPRESSION(S) / ED DIAGNOSES  Final diagnoses:  Chest pain, unspecified type     Note: This dictation was prepared with Dragon dictation. Any transcriptional errors that result from this process are unintentional     Nance Pear, MD 07/06/17 1152

## 2017-07-05 NOTE — Discharge Instructions (Signed)
You have been seen in the emergency department today for chest pain.  Your work-up including labs, EKG and x-ray do not show any acute abnormalities.  Please follow-up with Dr. Ubaldo Glassing in the office tomorrow.  Please call the number provided first thing in the morning to confirm your appointment.  Return to the emergency department for any worsening chest pain, shortness of breath, or any other symptom personally concerning to yourself.

## 2017-07-05 NOTE — ED Notes (Signed)

## 2017-07-05 NOTE — ED Provider Notes (Signed)
-----------------------------------------   5:35 PM on 07/05/2017 -----------------------------------------  Patient's repeat troponin is negative.  Patient states she will occasionally still have mild discomfort in the chest, she says it feels like reflux to her.  With 2- troponins and otherwise largely normal labs, I believe the patient would be safe for discharge home with close outpatient follow-up.  I discussed the patient with Dr. Nehemiah Massed, who also agrees with 2- troponins we believe the patient will be safe for outpatient follow-up.  He states they will see in the office tomorrow I discussed this with the patient, she is agreeable to this plan of care.   Harvest Dark, MD 07/05/17 1806

## 2017-08-15 DIAGNOSIS — L900 Lichen sclerosus et atrophicus: Secondary | ICD-10-CM

## 2017-08-15 HISTORY — DX: Lichen sclerosus et atrophicus: L90.0

## 2017-08-29 ENCOUNTER — Ambulatory Visit (INDEPENDENT_AMBULATORY_CARE_PROVIDER_SITE_OTHER): Payer: Medicare Other | Admitting: Obstetrics & Gynecology

## 2017-08-29 ENCOUNTER — Encounter: Payer: Self-pay | Admitting: Obstetrics & Gynecology

## 2017-08-29 ENCOUNTER — Other Ambulatory Visit (HOSPITAL_COMMUNITY)
Admission: RE | Admit: 2017-08-29 | Discharge: 2017-08-29 | Disposition: A | Discharge status: Home / Self Care | Payer: Medicare Other | Source: Ambulatory Visit | Attending: Obstetrics & Gynecology | Admitting: Obstetrics & Gynecology

## 2017-08-29 ENCOUNTER — Other Ambulatory Visit (HOSPITAL_COMMUNITY)
Admission: RE | Admit: 2017-08-29 | Payer: Medicare Other | Source: Ambulatory Visit | Admitting: Obstetrics & Gynecology

## 2017-08-29 VITALS — BP 140/66 | HR 87 | Ht 61.0 in | Wt 198.0 lb

## 2017-08-29 DIAGNOSIS — N898 Other specified noninflammatory disorders of vagina: Secondary | ICD-10-CM

## 2017-08-29 DIAGNOSIS — N904 Leukoplakia of vulva: Secondary | ICD-10-CM | POA: Insufficient documentation

## 2017-08-29 DIAGNOSIS — N903 Dysplasia of vulva, unspecified: Secondary | ICD-10-CM | POA: Insufficient documentation

## 2017-08-29 DIAGNOSIS — B373 Candidiasis of vulva and vagina: Secondary | ICD-10-CM

## 2017-08-29 DIAGNOSIS — B3731 Acute candidiasis of vulva and vagina: Secondary | ICD-10-CM

## 2017-08-29 DIAGNOSIS — L900 Lichen sclerosus et atrophicus: Secondary | ICD-10-CM | POA: Diagnosis not present

## 2017-08-29 MED ORDER — FLUCONAZOLE 150 MG PO TABS: 150.0000 mg | ORAL_TABLET | Freq: Once | ORAL | 3 refills | Status: AC

## 2017-08-29 NOTE — Patient Instructions (Signed)

## 2017-08-29 NOTE — Progress Notes (Signed)
HPI:      [name] is a 67 y.o. [id number], postmenopausal, presents today for a problem visit.  She complains of:  Vulvar concern:   This is a 67 y.o. old Caucasian/White female who presents for the evaluation of vulvar pain and dryness and irritation, also has been having vag discharge that is white, odorless, irritative and has burning.  Has tried ointment from urgent care facility with some relief.  Still has discharge though.  No recent UTI, denies dysuria.  No VB.  Sx's for 3 weeks.  PMHx: She  has a past medical history of Anxiety, DVT (deep venous thrombosis) ([place]), Hiatal hernia, Hyperlipemia, and IBS (irritable bowel syndrome). Also,  has a past surgical history that includes Breast biopsy (Right, 2002); Ankle surgery (Left, 2010); Shoulder surgery (Right, 2012); Hand surgery (Bilateral, 2014); Fine needle aspiration (Left, 10-01-14); and Breast cyst aspiration (Left, 10/02/2014)., family history includes Diabetes in her father and sister; Hypertension in her father; Pancreatic cancer in her mother.,  reports that she has never smoked. She has never used smokeless tobacco. She reports that she does not drink alcohol or use drugs.  She has a current medication list which includes the following prescription(s): alprazolam, esomeprazole, fluconazole, fluticasone, hydrochlorothiazide, loratadine, and mupirocin ointment. Also, is allergic to azithromycin; beclomethasone; doxycycline; erythromycin base; hydrocodone-acetaminophen; lansoprazole; levofloxacin in d5w; prednisone; sulfa antibiotics; amoxicillin; and cefuroxime axetil.  Review of Systems  Constitutional: Negative for chills, fever and malaise/fatigue.  HENT: Negative for congestion, sinus pain and sore throat.   Eyes: Negative for blurred vision and pain.  Respiratory: Negative for cough and wheezing.   Cardiovascular: Negative for chest pain and leg swelling.  Gastrointestinal: Negative for abdominal pain, constipation,  diarrhea, heartburn, nausea and vomiting.  Genitourinary: Negative for dysuria, frequency, hematuria and urgency.  Musculoskeletal: Negative for back pain, joint pain, myalgias and neck pain.  Skin: Negative for itching and rash.  Neurological: Negative for dizziness, tremors and weakness.  Endo/Heme/Allergies: Does not bruise/bleed easily.  Psychiatric/Behavioral: Negative for depression. The patient is not nervous/anxious and does not have insomnia.     Objective: BP 140/66 (BP Location: Left Arm, Patient Position: Sitting, Cuff Size: Normal)   Pulse 87   Ht 5\' 1"  (1.549 m)   Wt 198 lb (89.8 kg)   BMI 37.41 kg/m  Physical Exam  Constitutional: She is oriented to person, place, and time. She appears well-developed and well-nourished. No distress.  Genitourinary: Vagina normal and uterus normal. Pelvic exam was performed with patient supine. There is no rash, tenderness or lesion on the right labia. There is no rash, tenderness or lesion on the left labia. No erythema or bleeding in the vagina. Right adnexum does not display mass and does not display tenderness. Left adnexum does not display mass and does not display tenderness. Cervix does not exhibit motion tenderness, discharge, polyp or nabothian cyst.   Uterus is mobile and midaxial. Uterus is not enlarged or exhibiting a mass.  HENT:  Head: Normocephalic and atraumatic.  Nose: Nose normal.  Mouth/Throat: Oropharynx is clear and moist.  Abdominal: Soft. She exhibits no distension. There is no tenderness.  Musculoskeletal: Normal range of motion.  Neurological: She is alert and oriented to person, place, and time. No cranial nerve deficit.  Skin: Skin is warm and dry.  Psychiatric: She has a normal mood and affect.   Microscopic wet-mount exam shows hyphae.  VULVAR BIOPSY NOTE The indications for vulvar biopsy (rule out neoplasia, establish lichen sclerosus diagnosis) were reviewed.  Risks of the biopsy including pain,  bleeding, infection, inadequate specimen, scarring and need for additional procedures  were discussed. The patient stated understanding and agreed to undergo procedure today. Consent was signed,  time out performed.   The patient's vulva was prepped with Betadine. 1% lidocaine was injected into area of concern. A 3 -mm punch biopsy was done, biopsy tissue was picked up with sterile forceps and sterile scissors were used to excise the lesion.  Small bleeding was noted and hemostasis was achieved using silver nitrate sticks.  The patient tolerated the procedure well. Post-procedure instructions  (pelvic rest for one week) were given to the patient. The patient is to call with heavy bleeding, fever greater than 100.4, foul smelling vaginal discharge or other concerns.   ASSESSMENT/PLAN:   Problem List Items Addressed This Visit      Other   Vaginal discharge   Cervicovaginal ancillary only    Other Visit Diagnoses    Vulvar intraepithelial neoplasia with lichen sclerosus    -  Primary   Surgical pathology   Vagina, candidiasis        Skin bx Cultures Tx Yeast Infection Call w results  Barnett Applebaum, MD, [facility], [facility] 08/29/2017  11:41 AM

## 2017-08-30 LAB — CERVICOVAGINAL ANCILLARY ONLY
BACTERIAL VAGINITIS: NEGATIVE
CANDIDA VAGINITIS: NEGATIVE

## 2017-09-01 ENCOUNTER — Other Ambulatory Visit: Payer: Self-pay | Admitting: Obstetrics & Gynecology

## 2017-09-01 NOTE — Progress Notes (Signed)
Results- Lichen Sclerosus; no infection Plan- Clobetasol external cream for dry or sensitive areas LM

## 2017-09-02 ENCOUNTER — Telehealth: Payer: Self-pay | Admitting: Obstetrics & Gynecology

## 2017-09-02 NOTE — Telephone Encounter (Signed)
Patient is calling for labs results. Please advise. Patient's cell phone wasn't turned on yesterday but will be on for your call back

## 2017-09-05 ENCOUNTER — Other Ambulatory Visit: Payer: Self-pay | Admitting: Obstetrics & Gynecology

## 2017-09-05 MED ORDER — CLOBETASOL PROPIONATE 0.05 % EX CREA: 1.0000 "application " | TOPICAL_CREAM | Freq: Two times a day (BID) | CUTANEOUS | 2 refills | Status: DC

## 2017-11-23 ENCOUNTER — Other Ambulatory Visit: Payer: Self-pay | Admitting: Internal Medicine

## 2017-11-23 DIAGNOSIS — I739 Peripheral vascular disease, unspecified: Secondary | ICD-10-CM

## 2017-11-23 DIAGNOSIS — R42 Dizziness and giddiness: Secondary | ICD-10-CM

## 2017-11-23 DIAGNOSIS — I779 Disorder of arteries and arterioles, unspecified: Secondary | ICD-10-CM

## 2017-11-23 DIAGNOSIS — R270 Ataxia, unspecified: Secondary | ICD-10-CM

## 2017-11-23 DIAGNOSIS — H818X9 Other disorders of vestibular function, unspecified ear: Secondary | ICD-10-CM

## 2017-11-28 ENCOUNTER — Ambulatory Visit
Admission: RE | Admit: 2017-11-28 | Discharge: 2017-11-28 | Disposition: A | Discharge status: Home / Self Care | Payer: Medicare Other | Source: Ambulatory Visit | Attending: Internal Medicine | Admitting: Internal Medicine

## 2017-11-28 DIAGNOSIS — I779 Disorder of arteries and arterioles, unspecified: Secondary | ICD-10-CM

## 2017-11-28 DIAGNOSIS — I739 Peripheral vascular disease, unspecified: Secondary | ICD-10-CM | POA: Insufficient documentation

## 2017-11-28 DIAGNOSIS — R270 Ataxia, unspecified: Secondary | ICD-10-CM | POA: Diagnosis present

## 2017-11-28 DIAGNOSIS — R42 Dizziness and giddiness: Secondary | ICD-10-CM | POA: Diagnosis present

## 2017-12-27 ENCOUNTER — Ambulatory Visit (INDEPENDENT_AMBULATORY_CARE_PROVIDER_SITE_OTHER): Payer: Medicare Other | Admitting: Obstetrics and Gynecology

## 2017-12-27 ENCOUNTER — Encounter: Payer: Self-pay | Admitting: Obstetrics and Gynecology

## 2017-12-27 VITALS — BP 160/86 | HR 85 | Ht 61.0 in | Wt 195.0 lb

## 2017-12-27 DIAGNOSIS — N898 Other specified noninflammatory disorders of vagina: Secondary | ICD-10-CM | POA: Diagnosis not present

## 2017-12-27 DIAGNOSIS — L900 Lichen sclerosus et atrophicus: Secondary | ICD-10-CM | POA: Diagnosis not present

## 2017-12-27 LAB — POCT WET PREP WITH KOH
Clue Cells Wet Prep HPF POC: NEGATIVE
KOH PREP POC: NEGATIVE
Trichomonas, UA: NEGATIVE
YEAST WET PREP PER HPF POC: NEGATIVE

## 2017-12-27 MED ORDER — FLUCONAZOLE 150 MG PO TABS: 150.0000 mg | ORAL_TABLET | Freq: Once | ORAL | 0 refills | Status: AC

## 2017-12-27 NOTE — Progress Notes (Signed)
Adin Hector, MD   Chief Complaint  Patient presents with  . Vaginitis    used monistat 3 times, no discharge or odor, per pt there is irritation inside vaginal area x 2 days    HPI:      [name] is a 67 y.o. [id number] who LMP was No LMP recorded. Patient is postmenopausal., presents today for vaginal irritation for the past few days. Pt treated with monistat-3 without sx relief. Still having vag itch/irritation, as well as spine and LLQ discomfort (happens when pt has yeast sx per report). No increased d/c/odor. Hx of LS on bx 7/19 and treated with clobetasol prn sx. Has been using it a few times recently. No recent abx use. Uses dove sens skin soap, dryer sheets, douched a few months ago.    Past Medical History:  Diagnosis Date  . Anxiety   . DVT (deep venous thrombosis) ([place])   . Hiatal hernia   . Hyperlipemia   . IBS (irritable bowel syndrome)   . Lichen sclerosus [id number]/7893   on bx    Past Surgical History:  Procedure Laterality Date  . ANKLE SURGERY Left 2010   torn ligament  . BREAST BIOPSY Right 2002   neg/ Dr Bary Castilla  . BREAST CYST ASPIRATION Left 10/02/2014   cyst asp byrnett 6:00 4cmfn  . FINE NEEDLE ASPIRATION Left 10-01-14   benign  . HAND SURGERY Bilateral 2014  . SHOULDER SURGERY Right 2012   torn ligament    Family History  Problem Relation Age of Onset  . Pancreatic cancer Mother   . Diabetes Father   . Hypertension Father   . Diabetes Sister     Social History   Socioeconomic History  . Marital status: Married    Spouse name: Not on file  . Number of children: Not on file  . Years of education: Not on file  . Highest education level: Not on file  Occupational History  . Not on file  Social Needs  . Financial resource strain: Not on file  . Food insecurity:    Worry: Not on file    Inability: Not on file  . Transportation needs:    Medical: Not on file    Non-medical: Not on file  Tobacco Use  . Smoking status: Never  Smoker  . Smokeless tobacco: Never Used  Substance and Sexual Activity  . Alcohol use: No    Alcohol/week: 0.0 standard drinks  . Drug use: No  . Sexual activity: Not Currently    Birth control/protection: Post-menopausal  Lifestyle  . Physical activity:    Days per week: Not on file    Minutes per session: Not on file  . Stress: Not on file  Relationships  . Social connections:    Talks on phone: Not on file    Gets together: Not on file    Attends religious service: Not on file    Active member of club or organization: Not on file    Attends meetings of clubs or organizations: Not on file    Relationship status: Not on file  . Intimate partner violence:    Fear of current or ex partner: Not on file    Emotionally abused: Not on file    Physically abused: Not on file    Forced sexual activity: Not on file  Other Topics Concern  . Not on file  Social History Narrative  . Not on file    Outpatient  Medications Prior to Visit  Medication Sig Dispense Refill  . ALPRAZolam (XANAX) 0.5 MG tablet Take 0.25 mg by mouth 2 (two) times daily as needed for anxiety.     . clobetasol cream (TEMOVATE) 9.93 % Apply 1 application topically 2 (two) times daily. Apply as needed, no more than 2 weeks consecutive use. 30 g 2  . esomeprazole (NEXIUM) 40 MG capsule Take 40 mg by mouth 2 (two) times daily.     . fluticasone (FLONASE) 50 MCG/ACT nasal spray Place into both nostrils as needed for allergies or rhinitis.    Marland Kitchen loratadine (CLARITIN) 10 MG tablet Take 10 mg by mouth daily as needed for allergies.    . hydrochlorothiazide (HYDRODIURIL) 12.5 MG tablet     . mupirocin ointment (BACTROBAN) 2 %      No facility-administered medications prior to visit.       ROS:  Review of Systems  Constitutional: Negative for fever.  Gastrointestinal: Negative for blood in stool, constipation, diarrhea, nausea and vomiting.  Genitourinary: Negative for dyspareunia, dysuria, flank pain, frequency,  hematuria, urgency, vaginal bleeding, vaginal discharge and vaginal pain.  Musculoskeletal: Negative for back pain.  Skin: Negative for rash.    OBJECTIVE:   Vitals:  BP (!) 160/86   Pulse 85   Ht 5\' 1"  (1.549 m)   Wt 195 lb (88.5 kg)   BMI 36.84 kg/m   Physical Exam  Constitutional: She is oriented to person, place, and time. Vital signs are normal. She appears well-developed.  Pulmonary/Chest: Effort normal.  Genitourinary: Vagina normal and uterus normal. There is tenderness on the right labia. There is no rash or lesion on the right labia. There is tenderness on the left labia. There is no rash or lesion on the left labia. Uterus is not enlarged and not tender. Cervix exhibits no motion tenderness. Right adnexum displays no mass and no tenderness. Left adnexum displays no mass and no tenderness. No erythema or tenderness in the vagina. No vaginal discharge found.  Genitourinary Comments: EXT VULVA WITH AREAS OF PALE SKIN, FISSURES; IRRITATION  Musculoskeletal: Normal range of motion.  Neurological: She is alert and oriented to person, place, and time.  Psychiatric: She has a normal mood and affect. Her behavior is normal. Thought content normal.  Vitals reviewed.   Results: Results for orders placed or performed in visit on 12/27/17 (from the past 24 hour(s))  POCT Wet Prep with KOH     Status: Normal   Collection Time: 12/27/17  3:51 PM  Result Value Ref Range   Trichomonas, UA Negative    Clue Cells Wet Prep HPF POC NEG    Epithelial Wet Prep HPF POC     Yeast Wet Prep HPF POC NEG    Bacteria Wet Prep HPF POC     RBC Wet Prep HPF POC     WBC Wet Prep HPF POC     KOH Prep POC Negative Negative     Assessment/Plan: Lichen sclerosus - Neg wet prep/pos exam for LS. Most likely cause of vag sx. Use clobetasol 2-3 times wkly for maintenance and tx. F/u prn.   Vaginal irritation - Neg wet prep but treat empirically with Rx diflucan. Vaginal hygiene reviewed. No dryer  sheets/douche/scented baths. Most likely sx due to LS. F/u prn - Plan: fluconazole (DIFLUCAN) 150 MG tablet, POCT Wet Prep with KOH    Meds ordered this encounter  Medications  . fluconazole (DIFLUCAN) 150 MG tablet    Sig: Take 1 tablet (150  mg total) by mouth once for 1 dose.    Dispense:  1 tablet    Refill:  0    Order Specific Question:   Supervising Provider    Answer:   Gae Dry [[id number]]      Return if symptoms worsen or fail to improve.  Alicia B. Copland, PA-C 12/27/2017 3:53 PM

## 2017-12-27 NOTE — Patient Instructions (Signed)
I value your feedback and entrusting us with your care. If you get a [facility] patient survey, I would appreciate you taking the time to let us know about your experience today. Thank you!  HEALTHY VAGINAL HYGIENE  AVOID   Panytyhose Synthetic underwear (wear COTTON underwear)  Tight pants/jeans Thongs Pantyliners Scented soaps/shower gels (use Dove Sensitive Skin soap or water to clean) Bubble bath/bath bombs Scented detergents  ALL dryer sheets (line dry underwear if using them on your other clothing) Feminine sprays/douches     

## 2018-01-10 ENCOUNTER — Ambulatory Visit (INDEPENDENT_AMBULATORY_CARE_PROVIDER_SITE_OTHER): Payer: Medicare Other | Admitting: Vascular Surgery

## 2018-03-14 ENCOUNTER — Other Ambulatory Visit (HOSPITAL_COMMUNITY): Payer: Self-pay | Admitting: Internal Medicine

## 2018-03-14 ENCOUNTER — Other Ambulatory Visit: Payer: Self-pay | Admitting: Internal Medicine

## 2018-03-14 DIAGNOSIS — R1084 Generalized abdominal pain: Secondary | ICD-10-CM

## 2018-03-15 ENCOUNTER — Other Ambulatory Visit: Payer: Self-pay | Admitting: Internal Medicine

## 2018-03-15 DIAGNOSIS — R1084 Generalized abdominal pain: Secondary | ICD-10-CM

## 2018-03-17 ENCOUNTER — Encounter (INDEPENDENT_AMBULATORY_CARE_PROVIDER_SITE_OTHER): Payer: Self-pay

## 2018-03-17 ENCOUNTER — Ambulatory Visit
Admission: RE | Admit: 2018-03-17 | Discharge: 2018-03-17 | Disposition: A | Discharge status: Home / Self Care | Payer: Medicare HMO | Source: Ambulatory Visit | Attending: Internal Medicine | Admitting: Internal Medicine

## 2018-03-17 ENCOUNTER — Ambulatory Visit: Payer: Medicare Other

## 2018-03-17 DIAGNOSIS — R1084 Generalized abdominal pain: Secondary | ICD-10-CM | POA: Insufficient documentation

## 2018-11-03 ENCOUNTER — Other Ambulatory Visit: Payer: Self-pay | Admitting: Internal Medicine

## 2018-11-03 ENCOUNTER — Other Ambulatory Visit (HOSPITAL_COMMUNITY): Payer: Self-pay | Admitting: Internal Medicine

## 2018-11-03 DIAGNOSIS — R1084 Generalized abdominal pain: Secondary | ICD-10-CM

## 2018-11-03 DIAGNOSIS — R9389 Abnormal findings on diagnostic imaging of other specified body structures: Secondary | ICD-10-CM

## 2018-11-09 ENCOUNTER — Ambulatory Visit: Payer: Medicare HMO

## 2018-11-14 ENCOUNTER — Other Ambulatory Visit: Payer: Self-pay

## 2018-11-14 ENCOUNTER — Ambulatory Visit
Admission: RE | Admit: 2018-11-14 | Discharge: 2018-11-14 | Disposition: A | Discharge status: Home / Self Care | Payer: Medicare HMO | Source: Ambulatory Visit | Attending: Internal Medicine | Admitting: Internal Medicine

## 2018-11-14 DIAGNOSIS — R1084 Generalized abdominal pain: Secondary | ICD-10-CM | POA: Diagnosis not present

## 2018-11-14 DIAGNOSIS — R9389 Abnormal findings on diagnostic imaging of other specified body structures: Secondary | ICD-10-CM | POA: Diagnosis present

## 2018-11-23 ENCOUNTER — Other Ambulatory Visit: Payer: Self-pay | Admitting: Internal Medicine

## 2018-11-23 DIAGNOSIS — N649 Disorder of breast, unspecified: Secondary | ICD-10-CM

## 2018-11-30 ENCOUNTER — Ambulatory Visit
Admission: RE | Admit: 2018-11-30 | Discharge: 2018-11-30 | Disposition: A | Discharge status: Home / Self Care | Payer: Medicare HMO | Source: Ambulatory Visit | Attending: Internal Medicine | Admitting: Internal Medicine

## 2018-11-30 DIAGNOSIS — N649 Disorder of breast, unspecified: Secondary | ICD-10-CM | POA: Diagnosis not present

## 2019-06-02 ENCOUNTER — Emergency Department
Admission: EM | Admit: 2019-06-02 | Discharge: 2019-06-02 | Disposition: A | Discharge status: Home / Self Care | Payer: Medicare HMO | Attending: Emergency Medicine | Admitting: Emergency Medicine

## 2019-06-02 ENCOUNTER — Other Ambulatory Visit: Payer: Self-pay

## 2019-06-02 ENCOUNTER — Encounter: Payer: Self-pay | Admitting: Emergency Medicine

## 2019-06-02 DIAGNOSIS — Z5321 Procedure and treatment not carried out due to patient leaving prior to being seen by health care provider: Secondary | ICD-10-CM | POA: Diagnosis not present

## 2019-06-02 DIAGNOSIS — M545 Low back pain: Secondary | ICD-10-CM | POA: Diagnosis not present

## 2019-06-02 DIAGNOSIS — R1032 Left lower quadrant pain: Secondary | ICD-10-CM | POA: Insufficient documentation

## 2019-06-02 LAB — COMPREHENSIVE METABOLIC PANEL
ALT: 17 U/L (ref 0–44)
AST: 20 U/L (ref 15–41)
Albumin: 4.1 g/dL (ref 3.5–5.0)
Alkaline Phosphatase: 135 U/L — ABNORMAL HIGH (ref 38–126)
Anion gap: 10 (ref 5–15)
BUN: 24 mg/dL — ABNORMAL HIGH (ref 8–23)
CO2: 26 mmol/L (ref 22–32)
Calcium: 9.4 mg/dL (ref 8.9–10.3)
Chloride: 102 mmol/L (ref 98–111)
Creatinine, Ser: 0.89 mg/dL (ref 0.44–1.00)
GFR calc Af Amer: >60 mL/min (ref >60)
GFR calc non Af Amer: >60 mL/min (ref >60)
Glucose, Bld: 109 mg/dL — ABNORMAL HIGH (ref 70–99)
Potassium: 3.7 mmol/L (ref 3.5–5.1)
Sodium: 138 mmol/L (ref 135–145)
Total Bilirubin: 0.6 mg/dL (ref 0.3–1.2)
Total Protein: 7.8 g/dL (ref 6.5–8.1)

## 2019-06-02 LAB — CBC
HCT: 39.7 % (ref 36.0–46.0)
Hemoglobin: 13.1 g/dL (ref 12.0–15.0)
MCH: 27.6 pg (ref 26.0–34.0)
MCHC: 33 g/dL (ref 30.0–36.0)
MCV: 83.6 fL (ref 80.0–100.0)
Platelets: 311 10*3/uL (ref 150–400)
RBC: 4.75 MIL/uL (ref 3.87–5.11)
RDW: 13.3 % (ref 11.5–15.5)
WBC: 11.3 10*3/uL — ABNORMAL HIGH (ref 4.0–10.5)
nRBC: 0 % (ref 0.0–0.2)

## 2019-06-02 LAB — URINALYSIS, COMPLETE (UACMP) WITH MICROSCOPIC
Bacteria, UA: NONE SEEN
Bilirubin Urine: NEGATIVE
Glucose, UA: NEGATIVE mg/dL
Hgb urine dipstick: NEGATIVE
Ketones, ur: NEGATIVE mg/dL
Leukocytes,Ua: NEGATIVE
Nitrite: NEGATIVE
Protein, ur: NEGATIVE mg/dL
Specific Gravity, Urine: 1.008 (ref 1.005–1.030)
pH: 5 (ref 5.0–8.0)

## 2019-06-02 LAB — LIPASE, BLOOD: Lipase: 22 U/L (ref 11–51)

## 2019-06-02 MED ORDER — SODIUM CHLORIDE 0.9% FLUSH: 3.0000 mL | Freq: Once | INTRAVENOUS | Status: DC

## 2019-06-02 NOTE — ED Notes (Signed)
Pt given cup for urine sample unable to give sample at this time.

## 2019-06-02 NOTE — ED Triage Notes (Signed)
Pt to ED via POV c/o abd pain and chills since this afternoon. Pt state that the pain is in her left lower abd and in her back. Pt states that her uterus is "coming down". Pt states that sometimes it hangs ou more than others. Pt is in NAD at this time.

## 2019-06-08 ENCOUNTER — Ambulatory Visit (INDEPENDENT_AMBULATORY_CARE_PROVIDER_SITE_OTHER): Payer: Medicare HMO | Admitting: Obstetrics & Gynecology

## 2019-06-08 ENCOUNTER — Encounter: Payer: Self-pay | Admitting: Obstetrics & Gynecology

## 2019-06-08 ENCOUNTER — Other Ambulatory Visit: Payer: Self-pay

## 2019-06-08 VITALS — BP 140/80 | Ht 61.0 in | Wt 177.0 lb

## 2019-06-08 DIAGNOSIS — N812 Incomplete uterovaginal prolapse: Secondary | ICD-10-CM

## 2019-06-08 HISTORY — DX: Incomplete uterovaginal prolapse: N81.2

## 2019-06-08 NOTE — Progress Notes (Signed)
CC: Vaginal Pressure Patient complains of a vaginal pressure that began about 2 weeks ago.  No bladder urgency, freq, nocturia, incontinence.  Just feels a bulge in the vagina.  Pressure, no pain.  Not sexually active.  Symptoms include: prolapse of tissue with straining. Symptoms have gradually worsened.    PMHx: She  has a past medical history of Anxiety, DVT (deep venous thrombosis) ([place]), Hiatal hernia, Hyperlipemia, IBS (irritable bowel syndrome), and Lichen sclerosus (Q000111Q). Also,  has a past surgical history that includes Breast biopsy (Right, 2002); Ankle surgery (Left, 2010); Shoulder surgery (Right, 2012); Hand surgery (Bilateral, 2014); Fine needle aspiration (Left, 10-01-14); and Breast cyst aspiration (Left, 10/02/2014)., family history includes Diabetes in her father and sister; Hypertension in her father; Pancreatic cancer in her mother.,  reports that she has never smoked. She has never used smokeless tobacco. She reports that she does not drink alcohol or use drugs.  She has a current medication list which includes the following prescription(s): alprazolam, clobetasol cream, esomeprazole, fluticasone, and loratadine. Also, is allergic to azithromycin; beclomethasone; doxycycline; erythromycin base; hydrocodone-acetaminophen; lansoprazole; levofloxacin in d5w; penicillin v potassium; prednisone; sulfa antibiotics; amoxicillin; cefuroxime axetil; and codeine.  Review of Systems  Constitutional: Negative for chills, fever and malaise/fatigue.  HENT: Negative for congestion, sinus pain and sore throat.   Eyes: Negative for blurred vision and pain.  Respiratory: Negative for cough and wheezing.   Cardiovascular: Negative for chest pain and leg swelling.  Gastrointestinal: Negative for abdominal pain, constipation, diarrhea, heartburn, nausea and vomiting.  Genitourinary: Negative for dysuria, frequency, hematuria and urgency.  Musculoskeletal: Negative for back pain, joint pain,  myalgias and neck pain.  Skin: Negative for itching and rash.  Neurological: Negative for dizziness, tremors and weakness.  Endo/Heme/Allergies: Does not bruise/bleed easily.  Psychiatric/Behavioral: Negative for depression. The patient is not nervous/anxious and does not have insomnia.     Objective: BP 140/80   Ht 5\' 1"  (1.549 m)   Wt 177 lb (80.3 kg)   BMI 33.44 kg/m  Physical Exam Constitutional:      General: She is not in acute distress.    Appearance: She is well-developed.  Genitourinary:     Pelvic exam was performed with patient supine.     Urethra, bladder, vagina and uterus normal.     No vaginal erythema or bleeding.     No cervical motion tenderness, discharge, polyp or nabothian cyst.     Uterus is mobile.     Uterus is not enlarged.     No uterine mass detected.    Uterus is midaxial.     No right or left adnexal mass present.     Right adnexa not tender.     Left adnexa not tender.     Genitourinary Comments: Gr 3 uterine prolapse Gr 3 cystocele Min rectocele Mild atrophy Improved areas of lichen sclerosus  HENT:     Head: Normocephalic and atraumatic.     Nose: Nose normal.  Abdominal:     General: There is no distension.     Palpations: Abdomen is soft.     Tenderness: There is no abdominal tenderness.  Musculoskeletal:        General: Normal range of motion.  Neurological:     Mental Status: She is alert and oriented to person, place, and time.     Cranial Nerves: No cranial nerve deficit.  Skin:    General: Skin is warm and dry.  Psychiatric:        Attention  and Perception: Attention normal.        Mood and Affect: Mood and affect normal.        Speech: Speech normal.        Behavior: Behavior normal.        Thought Content: Thought content normal.        Judgment: Judgment normal.     ASSESSMENT/PLAN:  New onset Uterine prolapse with cystocele  Problem List Items Addressed This Visit      Genitourinary   Cystocele with incomplete  uterovaginal prolapse - Primary    Options for treatment d/w pt.  Surgery would involve [facility], [place].  Pessary is the alternative.  No tx also an option.  Treat for quality of life measures.  No sig bladder sx's at this time. Pessary decided upon today.  See fitting below.  To order appropriate pessary and place when arrives at future appt.   Pessary Fitting Patient presents for a pessary fitting. She desires a pessary as her means of controlling her symptoms of prolapse and/or urinary incontinence. She understands the care needed for a pessary and desires to proceed. Alternative treatment options have been discussed at length and the patient voices an understanding of each option.   PROCEDURE: The patient was placed in dorsal lithotomy position. Examination confirmed prolapse. A 3 Ring pessary was fitted without difficulty. The patient subsequently ambulated, voided and performed valsalva maneuvers without dislodging the pessary and without discomfort. Care instructions were provided. Patient was discharged to home in stable condition.   Pessary Ring w Support #3 ordered.  Barnett Applebaum, MD, [facility], [facility] 06/08/2019  4:37 PM

## 2019-06-12 ENCOUNTER — Encounter: Payer: Self-pay | Admitting: Obstetrics & Gynecology

## 2019-06-22 ENCOUNTER — Telehealth: Payer: Self-pay | Admitting: Obstetrics & Gynecology

## 2019-06-22 NOTE — Telephone Encounter (Signed)
Patient is calling to speak with Dr. Doreene Adas nurse. No message left. Please advise

## 2019-06-22 NOTE — Telephone Encounter (Signed)
Pt aware pessary has not arrived yet

## 2019-06-29 NOTE — Telephone Encounter (Signed)
Pessary has arrived. Please schedule apt for placement at her convenience.

## 2019-07-02 NOTE — Telephone Encounter (Signed)
Patient is schedule for 07/03/19 at 2 pm with [facility]

## 2019-07-03 ENCOUNTER — Other Ambulatory Visit: Payer: Self-pay

## 2019-07-03 ENCOUNTER — Encounter: Payer: Self-pay | Admitting: Obstetrics & Gynecology

## 2019-07-03 ENCOUNTER — Ambulatory Visit (INDEPENDENT_AMBULATORY_CARE_PROVIDER_SITE_OTHER): Payer: Medicare HMO | Admitting: Obstetrics & Gynecology

## 2019-07-03 VITALS — BP 148/80 | Ht 61.0 in | Wt 179.0 lb

## 2019-07-03 DIAGNOSIS — N812 Incomplete uterovaginal prolapse: Secondary | ICD-10-CM

## 2019-07-03 NOTE — Progress Notes (Signed)
HPI:      [name] is a 69 y.o. [id number] who presents today for her pessary placement and examination related to her pelvic floor weakening. She has been having significant sx's related to pelvic floor weakening.  Vaginal pressure especially.  No urinary leakage.  PMHx: She  has a past medical history of Anxiety, DVT (deep venous thrombosis) ([place]), Hiatal hernia, Hyperlipemia, IBS (irritable bowel syndrome), and Lichen sclerosus (Q000111Q). Also,  has a past surgical history that includes Breast biopsy (Right, 2002); Ankle surgery (Left, 2010); Shoulder surgery (Right, 2012); Hand surgery (Bilateral, 2014); Fine needle aspiration (Left, 10-01-14); and Breast cyst aspiration (Left, 10/02/2014)., family history includes Diabetes in her father and sister; Hypertension in her father; Pancreatic cancer in her mother.,  reports that she has never smoked. She has never used smokeless tobacco. She reports that she does not drink alcohol or use drugs.  She has a current medication list which includes the following prescription(s): alprazolam, clobetasol cream, esomeprazole, fluticasone, and loratadine. Also, is allergic to azithromycin; beclomethasone; doxycycline; erythromycin base; hydrocodone-acetaminophen; lansoprazole; levofloxacin in d5w; penicillin v potassium; prednisone; sulfa antibiotics; amoxicillin; cefuroxime axetil; and codeine.  Review of Systems  All other systems reviewed and are negative.   Objective: BP (!) 148/80   Ht 5\' 1"  (1.549 m)   Wt 179 lb (81.2 kg)   BMI 33.82 kg/m  Physical Exam Constitutional:      General: She is not in acute distress.    Appearance: She is well-developed.  Genitourinary:     Pelvic exam was performed with patient supine.     Vagina and uterus normal.     No vaginal erythema or bleeding.     No cervical motion tenderness, discharge, polyp or nabothian cyst.     Uterus is mobile.     Uterus is not enlarged.     No uterine mass detected.  Uterus is midaxial.     No right or left adnexal mass present.     Right adnexa not tender.     Left adnexa not tender.     Genitourinary Comments: Gr 3 uterine prolapse Gr 3 cystocele Min rectocele Mild atrophy  HENT:     Head: Normocephalic and atraumatic.     Nose: Nose normal.  Abdominal:     General: There is no distension.     Palpations: Abdomen is soft.     Tenderness: There is no abdominal tenderness.  Musculoskeletal:        General: Normal range of motion.  Neurological:     Mental Status: She is alert and oriented to person, place, and time.     Cranial Nerves: No cranial nerve deficit.  Skin:    General: Skin is warm and dry.  Psychiatric:        Attention and Perception: Attention normal.        Mood and Affect: Mood and affect normal.        Speech: Speech normal.        Behavior: Behavior normal.        Thought Content: Thought content normal.        Judgment: Judgment normal.    A/P:   ICD-10-CM   1. Cystocele with incomplete uterovaginal prolapse  N81.2    Pessary was placed for first time today.  Ring w support #3. Instructions given for care. Concerning symptoms to observe for are counseled to patient. Follow up scheduled for 3 months.  A total of 20 minutes were spent  face-to-face with the patient as well as preparation, review, communication, and documentation during this encounter.   Barnett Applebaum, MD, [facility], [facility] 07/03/2019  1:40 PM

## 2019-10-08 ENCOUNTER — Encounter: Payer: Self-pay | Admitting: Obstetrics & Gynecology

## 2019-10-08 ENCOUNTER — Other Ambulatory Visit: Payer: Self-pay

## 2019-10-08 ENCOUNTER — Ambulatory Visit (INDEPENDENT_AMBULATORY_CARE_PROVIDER_SITE_OTHER): Payer: Medicare HMO | Admitting: Obstetrics & Gynecology

## 2019-10-08 VITALS — BP 150/90 | Ht 61.0 in | Wt 177.0 lb

## 2019-10-08 DIAGNOSIS — N812 Incomplete uterovaginal prolapse: Secondary | ICD-10-CM

## 2019-10-08 NOTE — Progress Notes (Signed)
HPI:      [name] is a 69 y.o. [id number] who presents today for her pessary follow up and examination related to her pelvic floor weakening.  Pt reports tolerating the pessary well with no vaginal bleeding and no vaginal discharge.  Symptoms of pelvic floor weakening have greatly improved. She is voiding and defecating without difficulty. She currently has a RING w SUPPORT #3 pessary.  PMHx: She  has a past medical history of Anxiety, DVT (deep venous thrombosis) ([place]), Hiatal hernia, Hyperlipemia, IBS (irritable bowel syndrome), and Lichen sclerosus (33/5456). Also,  has a past surgical history that includes Breast biopsy (Right, 2002); Ankle surgery (Left, 2010); Shoulder surgery (Right, 2012); Hand surgery (Bilateral, 2014); Fine needle aspiration (Left, 10-01-14); and Breast cyst aspiration (Left, 10/02/2014)., family history includes Diabetes in her father and sister; Hypertension in her father; Pancreatic cancer in her mother.,  reports that she has never smoked. She has never used smokeless tobacco. She reports that she does not drink alcohol and does not use drugs.  She has a current medication list which includes the following prescription(s): alprazolam, clobetasol cream, esomeprazole, fluticasone, and loratadine. Also, is allergic to azithromycin, beclomethasone, doxycycline, erythromycin base, hydrocodone-acetaminophen, lansoprazole, levofloxacin in d5w, penicillin v potassium, prednisone, sulfa antibiotics, amoxicillin, cefuroxime axetil, and codeine.  Review of Systems  All other systems reviewed and are negative.   Objective: BP (!) 150/90   Ht 5\' 1"  (1.549 m)   Wt 177 lb (80.3 kg)   BMI 33.44 kg/m  Physical Exam Constitutional:      General: She is not in acute distress.    Appearance: She is well-developed.  Genitourinary:     Pelvic exam was performed with patient supine.     Urethra, bladder, vagina and uterus normal.     No vaginal erythema or bleeding.     No  cervical motion tenderness, discharge, polyp or nabothian cyst.     Uterus is mobile.     Uterus is not enlarged.     No uterine mass detected.    Uterus is midaxial.     No right or left adnexal mass present.     Right adnexa not tender.     Left adnexa not tender.     Genitourinary Comments: Gr 3 uterine prolapse Gr 3 cystocele Min rectocele Mild atrophy   HENT:     Head: Normocephalic and atraumatic.     Nose: Nose normal.  Abdominal:     General: There is no distension.     Palpations: Abdomen is soft.     Tenderness: There is no abdominal tenderness.  Musculoskeletal:        General: Normal range of motion.  Neurological:     Mental Status: She is alert and oriented to person, place, and time.     Cranial Nerves: No cranial nerve deficit.  Skin:    General: Skin is warm and dry.  Psychiatric:        Attention and Perception: Attention normal.        Mood and Affect: Mood and affect normal.        Speech: Speech normal.        Behavior: Behavior normal.        Thought Content: Thought content normal.        Judgment: Judgment normal.     Pessary Care Pessary removed and cleaned.  Vagina checked - without erosions - pessary replaced.  A/P:   ICD-10-CM   1. Cystocele with  incomplete uterovaginal prolapse  N81.2    Pessary was cleaned and replaced today. Instructions given for care. Concerning symptoms to observe for are counseled to patient. Follow up scheduled for 3 months.  A total of 20 minutes were spent face-to-face with the patient as well as preparation, review, communication, and documentation during this encounter.   Barnett Applebaum, MD, [facility], [facility] 10/08/2019  2:40 PM

## 2020-01-14 ENCOUNTER — Other Ambulatory Visit: Payer: Self-pay

## 2020-01-14 ENCOUNTER — Ambulatory Visit (INDEPENDENT_AMBULATORY_CARE_PROVIDER_SITE_OTHER): Payer: Medicare HMO | Admitting: Obstetrics & Gynecology

## 2020-01-14 ENCOUNTER — Encounter: Payer: Self-pay | Admitting: Obstetrics & Gynecology

## 2020-01-14 VITALS — BP 140/80 | Ht 61.0 in | Wt 172.0 lb

## 2020-01-14 DIAGNOSIS — N812 Incomplete uterovaginal prolapse: Secondary | ICD-10-CM | POA: Diagnosis not present

## 2020-01-14 NOTE — Patient Instructions (Signed)
Thank you for choosing [facility]. As part of our ongoing efforts to improve patient experience, we would appreciate your feedback. Please fill out the short survey that you will receive by mail or MyChart. Your opinion is important to us! -Dr Mckala Pantaleon  

## 2020-01-14 NOTE — Progress Notes (Signed)
HPI:      [name] is a 69 y.o. [id number] who presents today for her pessary follow up and examination related to her pelvic floor weakening.  Pt reports tolerating the pessary well with no vaginal bleeding and no vaginal discharge.  Symptoms of pelvic floor weakening have greatly improved. She is voiding and defecating without difficulty. She currently has a Ring #3 pessary.  PMHx: She  has a past medical history of Anxiety, DVT (deep venous thrombosis) ([place]), Hiatal hernia, Hyperlipemia, IBS (irritable bowel syndrome), and Lichen sclerosus (76/7341). Also,  has a past surgical history that includes Breast biopsy (Right, 2002); Ankle surgery (Left, 2010); Shoulder surgery (Right, 2012); Hand surgery (Bilateral, 2014); Fine needle aspiration (Left, 10-01-14); and Breast cyst aspiration (Left, 10/02/2014)., family history includes Diabetes in her father and sister; Hypertension in her father; Pancreatic cancer in her mother.,  reports that she has never smoked. She has never used smokeless tobacco. She reports that she does not drink alcohol and does not use drugs.  She has a current medication list which includes the following prescription(s): alprazolam, clobetasol cream, esomeprazole, fluticasone, and loratadine. Also, is allergic to azithromycin, beclomethasone, doxycycline, erythromycin base, hydrocodone-acetaminophen, lansoprazole, levofloxacin in d5w, penicillin v potassium, prednisone, sulfa antibiotics, amoxicillin, cefuroxime axetil, and codeine.  Review of Systems  All other systems reviewed and are negative.   Objective: BP 140/80   Ht 5\' 1"  (1.549 m)   Wt 172 lb (78 kg)   BMI 32.50 kg/m  Physical Exam Constitutional:      General: She is not in acute distress.    Appearance: She is well-developed.  Genitourinary:     Pelvic exam was performed with patient supine.     Vagina and uterus normal.     No vaginal erythema or bleeding.     No cervical motion tenderness,  discharge, polyp or nabothian cyst.     Uterus is mobile.     Uterus is not enlarged.     No uterine mass detected.    Uterus is midaxial.     No right or left adnexal mass present.     Right adnexa not tender.     Left adnexa not tender.     Genitourinary Comments: Gr 3 uterine prolapse Gr 3 cystocele Min rectocele Mild atrophy    HENT:     Head: Normocephalic and atraumatic.     Nose: Nose normal.  Abdominal:     General: There is no distension.     Palpations: Abdomen is soft.     Tenderness: There is no abdominal tenderness.  Musculoskeletal:        General: Normal range of motion.  Neurological:     Mental Status: She is alert and oriented to person, place, and time.     Cranial Nerves: No cranial nerve deficit.  Skin:    General: Skin is warm and dry.  Psychiatric:        Attention and Perception: Attention normal.        Mood and Affect: Mood and affect normal.        Speech: Speech normal.        Behavior: Behavior normal.        Thought Content: Thought content normal.        Judgment: Judgment normal.     Pessary Care Pessary removed and cleaned.  Vagina checked - without erosions - pessary replaced.  A/P:   ICD-10-CM   1. Cystocele with incomplete uterovaginal prolapse  N81.2    Pessary was cleaned and replaced today. Instructions given for care. Concerning symptoms to observe for are counseled to patient. Follow up scheduled for 3 months.  A total of 20 minutes were spent face-to-face with the patient as well as preparation, review, communication, and documentation during this encounter.   Barnett Applebaum, MD, [facility], [facility] 01/14/2020  2:41 PM

## 2020-01-31 ENCOUNTER — Telehealth: Payer: Self-pay | Admitting: Obstetrics & Gynecology

## 2020-01-31 NOTE — Telephone Encounter (Signed)
If no other times available then must wait; last visit 01/14/20

## 2020-01-31 NOTE — Telephone Encounter (Signed)
Pt is requesting to be seen for Prolapse tearing and rawness. Next available  schedule time on your schedule is 02/14/20 are you able to work pt in before that date ?

## 2020-02-01 ENCOUNTER — Other Ambulatory Visit: Payer: Self-pay

## 2020-02-01 ENCOUNTER — Other Ambulatory Visit (HOSPITAL_COMMUNITY)
Admission: RE | Admit: 2020-02-01 | Discharge: 2020-02-01 | Disposition: A | Discharge status: Home / Self Care | Payer: Medicare HMO | Source: Ambulatory Visit | Attending: Obstetrics and Gynecology | Admitting: Obstetrics and Gynecology

## 2020-02-01 ENCOUNTER — Ambulatory Visit (INDEPENDENT_AMBULATORY_CARE_PROVIDER_SITE_OTHER): Payer: Medicare HMO | Admitting: Obstetrics and Gynecology

## 2020-02-01 ENCOUNTER — Encounter: Payer: Self-pay | Admitting: Obstetrics and Gynecology

## 2020-02-01 VITALS — BP 142/86 | Ht 61.0 in | Wt 170.0 lb

## 2020-02-01 DIAGNOSIS — N898 Other specified noninflammatory disorders of vagina: Secondary | ICD-10-CM | POA: Diagnosis not present

## 2020-02-01 DIAGNOSIS — T8389XA Other specified complication of genitourinary prosthetic devices, implants and grafts, initial encounter: Secondary | ICD-10-CM

## 2020-02-01 DIAGNOSIS — N760 Acute vaginitis: Secondary | ICD-10-CM | POA: Diagnosis not present

## 2020-02-01 NOTE — Telephone Encounter (Signed)
Pt calling; wants to talk to [facility] nurse; spoke c her yesterday.  [phone]

## 2020-02-01 NOTE — Telephone Encounter (Signed)
Pt aware [facility] has no opening on 02/05/20, she will keep her appointment with [facility]

## 2020-02-01 NOTE — Progress Notes (Signed)
Obstetrics & Gynecology Office Visit   Chief Complaint:  Chief Complaint  Patient presents with  . Pessary Check    Remove Pessary - RM 6    History of Present Illness: 69 y.o. with #3 pessary, who had pessary cleaning and replacement on 01/14/2020 and now reports vaginal irritation.  No vaginal bleeding.  No change in discharge or dysuria.     Review of Systems: Review of Systems  Constitutional: Negative.   Genitourinary: Negative.   Skin: Negative.      Past Medical History:  Past Medical History:  Diagnosis Date  . Anxiety   . DVT (deep venous thrombosis) ([place])   . Hiatal hernia   . Hyperlipemia   . IBS (irritable bowel syndrome)   . Lichen sclerosus 38/9373   on bx    Past Surgical History:  Past Surgical History:  Procedure Laterality Date  . ANKLE SURGERY Left 2010   torn ligament  . BREAST BIOPSY Right 2002   neg/ Dr Bary Castilla  . BREAST CYST ASPIRATION Left 10/02/2014   cyst asp byrnett 6:00 4cmfn  . FINE NEEDLE ASPIRATION Left 10-01-14   benign  . HAND SURGERY Bilateral 2014  . SHOULDER SURGERY Right 2012   torn ligament    Gynecologic History: No LMP recorded. Patient is postmenopausal.  Obstetric History: [id number]  Family History:  Family History  Problem Relation Age of Onset  . Pancreatic cancer Mother   . Diabetes Father   . Hypertension Father   . Diabetes Sister     Social History:  Social History   Socioeconomic History  . Marital status: Married    Spouse name: Not on file  . Number of children: Not on file  . Years of education: Not on file  . Highest education level: Not on file  Occupational History  . Not on file  Tobacco Use  . Smoking status: Never Smoker  . Smokeless tobacco: Never Used  Vaping Use  . Vaping Use: Never used  Substance and Sexual Activity  . Alcohol use: No    Alcohol/week: 0.0 standard drinks  . Drug use: No  . Sexual activity: Not Currently    Birth control/protection: Post-menopausal   Other Topics Concern  . Not on file  Social History Narrative  . Not on file   Social Determinants of Health   Financial Resource Strain: Not on file  Food Insecurity: Not on file  Transportation Needs: Not on file  Physical Activity: Not on file  Stress: Not on file  Social Connections: Not on file  Intimate Partner Violence: Not on file    Allergies:  Allergies  Allergen Reactions  . Azithromycin   . Beclomethasone Other (See Comments)  . Doxycycline Nausea Only  . Erythromycin Base Nausea And Vomiting  . Hydrocodone-Acetaminophen Nausea Only and Nausea And Vomiting  . Lansoprazole Nausea And Vomiting  . Levofloxacin In D5w Nausea Only  . Penicillin V Potassium Hives  . Prednisone Swelling  . Sulfa Antibiotics Other (See Comments)  . Amoxicillin Rash  . Cefuroxime Axetil Rash  . Codeine Palpitations    Medications: Prior to Admission medications   Medication Sig Start Date End Date Taking? Authorizing Provider  ALPRAZolam Duanne Moron) 0.5 MG tablet Take 0.25 mg by mouth 2 (two) times daily as needed for anxiety.    Yes [provider]  fluticasone (FLONASE) 50 MCG/ACT nasal spray Place into both nostrils as needed for allergies or rhinitis.   Yes [provider]  loratadine (  CLARITIN) 10 MG tablet Take 10 mg by mouth daily as needed for allergies.   Yes [provider]  clobetasol cream (TEMOVATE) 3.21 % Apply 1 application topically 2 (two) times daily. Apply as needed, no more than 2 weeks consecutive use. Patient not taking: Reported on 02/01/2020 09/05/17   Gae Dry, MD  esomeprazole (NEXIUM) 40 MG capsule Take 40 mg by mouth 2 (two) times daily.  Patient not taking: Reported on 02/01/2020 08/21/15   [provider]    Physical Exam Vitals:  Vitals:   02/01/20 1516  BP: (!) 142/86   No LMP recorded. Patient is postmenopausal.  General: NAD HEENT: normocephalic, anicteric Pulmonary: No increased work of  breathing Genitourinary:  External: Normal external female genitalia.  Normal urethral meatus, normal  Bartholin's and Skene's glands.    Vagina: Normal vaginal mucosa Extremities: no edema, erythema, or tenderness Neurologic: Grossly intact Psychiatric: mood appropriate, affect full  Female chaperone present for pelvic  portions of the physical exam  Assessment: 69 y.o. [id number] vaginitis  Plan: Problem List Items Addressed This Visit   None   Visit Diagnoses    Acute vaginitis    -  Primary   Relevant Orders   Cervicovaginal ancillary only   Vaginal irritation from pessary ([place])         1) Pessary removed and left out today.  Sister just passed away this week and funeral is this weekend.  Will allow any irritation to settle down then replace in 2 weeks.  Nuswab sent to rule out candida  2) Return in about 2 weeks (around 02/15/2020) for Pessary replacement Kenton Kingfisher).   Malachy Mood, MD, [facility], [facility] 02/01/2020, 3:34 PM

## 2020-02-05 LAB — CERVICOVAGINAL ANCILLARY ONLY
Candida Glabrata: NEGATIVE
Candida Vaginitis: NEGATIVE
Comment: NEGATIVE
Comment: NEGATIVE

## 2020-02-11 ENCOUNTER — Other Ambulatory Visit: Payer: Self-pay | Admitting: Obstetrics & Gynecology

## 2020-02-11 MED ORDER — CLOBETASOL PROPIONATE 0.05 % EX CREA: 1.0000 "application " | TOPICAL_CREAM | Freq: Two times a day (BID) | CUTANEOUS | 2 refills | Status: DC

## 2020-02-16 HISTORY — PX: BREAST BIOPSY: SHX20

## 2020-02-18 ENCOUNTER — Encounter: Payer: Self-pay | Admitting: Obstetrics & Gynecology

## 2020-02-18 ENCOUNTER — Other Ambulatory Visit: Payer: Self-pay

## 2020-02-18 ENCOUNTER — Ambulatory Visit (INDEPENDENT_AMBULATORY_CARE_PROVIDER_SITE_OTHER): Payer: Medicare HMO | Admitting: Obstetrics & Gynecology

## 2020-02-18 VITALS — BP 146/60 | Ht 61.0 in | Wt 168.0 lb

## 2020-02-18 DIAGNOSIS — L900 Lichen sclerosus et atrophicus: Secondary | ICD-10-CM

## 2020-02-18 DIAGNOSIS — N812 Incomplete uterovaginal prolapse: Secondary | ICD-10-CM

## 2020-02-18 NOTE — Progress Notes (Signed)
History of Present Illness:  Janet Phillips is a 70 y.o. who was started on Temovate  approximately 2 weeks ago. Since that time, she states that her symptoms show no change, or possibly are improving slowly.  She has a tender area on left vulva after pessary removed for irritation 2 weeks ago.  Sx's of prolapse present but mild.  Her main sx in past has been vag pressure; rare bladder sx's.  Not sexually active.  Also has Lichen Sclerosus.    PMHx: She  has a past medical history of Anxiety, DVT (deep venous thrombosis) ([facility]), Hiatal hernia, Hyperlipemia, IBS (irritable bowel syndrome), and Lichen sclerosus (08/2017). Also,  has a past surgical history that includes Breast biopsy (Right, 2002); Ankle surgery (Left, 2010); Shoulder surgery (Right, 2012); Hand surgery (Bilateral, 2014); Fine needle aspiration (Left, 10-01-14); and Breast cyst aspiration (Left, 10/02/2014)., family history includes Diabetes in her father and sister; Hypertension in her father; Pancreatic cancer in her mother.,  reports that she has never smoked. She has never used smokeless tobacco. She reports that she does not drink alcohol and does not use drugs. Current Meds  Medication Sig  . ALPRAZolam (XANAX) 0.5 MG tablet Take 0.25 mg by mouth 2 (two) times daily as needed for anxiety.   . clobetasol cream (TEMOVATE) 0.05 % Apply 1 application topically 2 (two) times daily. Apply as needed, no more than 2 weeks consecutive use.  . fluticasone (FLONASE) 50 MCG/ACT nasal spray Place into both nostrils as needed for allergies or rhinitis.  Marland Kitchen loratadine (CLARITIN) 10 MG tablet Take 10 mg by mouth daily as needed for allergies.  . Also, is allergic to azithromycin, beclomethasone, doxycycline, erythromycin base, hydrocodone-acetaminophen, lansoprazole, levofloxacin in d5w, penicillin v potassium, prednisone, sulfa antibiotics, amoxicillin, cefuroxime axetil, and codeine..  Review of Systems  All other systems reviewed and are  negative.   Physical Exam:  BP (!) 146/60   Ht 5\' 1"  (1.549 m)   Wt 168 lb (76.2 kg)   BMI 31.74 kg/m  Body mass index is 31.74 kg/m. Physical Exam Constitutional:      General: She is not in acute distress.    Appearance: She is well-developed.  Genitourinary:     Bladder, vagina and uterus normal.        No vaginal erythema or bleeding.     Vaginal exam comments: Mild atrophy .      Right Adnexa: not tender and no mass present.    Left Adnexa: not tender and no mass present.    No cervical motion tenderness, discharge, polyp or nabothian cyst.     Cervical exam comments:  Gr 3 uterine prolapse    .     Uterus is mobile.     Uterus is not enlarged.     No uterine mass detected.    Uterus is midaxial.     Bladder exam comments: Gr 3 cystocele Min rectocele.     Pelvic exam was performed with patient supine.  HENT:     Head: Normocephalic and atraumatic.     Nose: Nose normal.  Abdominal:     General: There is no distension.     Palpations: Abdomen is soft.     Tenderness: There is no abdominal tenderness.  Musculoskeletal:        General: Normal range of motion.  Neurological:     Mental Status: She is alert and oriented to person, place, and time.     Cranial Nerves: No cranial nerve deficit.  Skin:    General: Skin is warm and dry.  Psychiatric:        Attention and Perception: Attention normal.        Mood and Affect: Mood and affect normal.        Speech: Speech normal.        Behavior: Behavior normal.        Thought Content: Thought content normal.        Judgment: Judgment normal.     Assessment:  Problem List Items Addressed This Visit      Genitourinary   Cystocele with incomplete uterovaginal prolapse - Primary    Other Visit Diagnoses    Lichen sclerosus         Plan: She will cont Temovate external use Plan pessary replacement in 2 weeks    (Ring #3) Monitor for signs of vag pressure or pain or urinary sx changes.  She was  amenable to this plan  A total of 20 minutes were spent face-to-face with the patient as well as preparation, review, communication, and documentation during this encounter.   Barnett Applebaum, MD, [facility], [facility] 02/18/2020  4:38 PM

## 2020-02-18 NOTE — Patient Instructions (Signed)
Recommendations to boost your immunity to prevent illness such as viral flu and colds, including covid19, are as follows:       - - -  Vitamin K2 and Vitamin D3  - - - Take Vitamin K2 at 200-300 mcg daily (usually 2-3 pills daily of the over the counter formulation). Take Vitamin D3 at 3000-4000 U daily (usually 3-4 pills daily of the over the counter formulation). Studies show that these two at high normal levels in your system are very effective in keeping your immunity so strong and protective that you will be unlikely to contract viral illness such as those listed above.  Dr Rhydian Baldi  Thank you for choosing [facility]. As part of our ongoing efforts to improve patient experience, we would appreciate your feedback. Please fill out the short survey that you will receive by mail or MyChart. Your opinion is important to us! -Dr Remo Kirschenmann   

## 2020-03-03 ENCOUNTER — Ambulatory Visit: Payer: Medicare HMO | Admitting: Obstetrics & Gynecology

## 2020-03-17 ENCOUNTER — Ambulatory Visit: Payer: Medicare HMO | Admitting: Obstetrics & Gynecology

## 2020-03-25 ENCOUNTER — Other Ambulatory Visit: Payer: Self-pay

## 2020-03-25 ENCOUNTER — Ambulatory Visit (INDEPENDENT_AMBULATORY_CARE_PROVIDER_SITE_OTHER): Payer: Medicare HMO | Admitting: Obstetrics & Gynecology

## 2020-03-25 ENCOUNTER — Encounter: Payer: Self-pay | Admitting: Obstetrics & Gynecology

## 2020-03-25 VITALS — BP 140/80 | Ht 61.0 in | Wt 165.0 lb

## 2020-03-25 DIAGNOSIS — N760 Acute vaginitis: Secondary | ICD-10-CM | POA: Diagnosis not present

## 2020-03-25 DIAGNOSIS — N812 Incomplete uterovaginal prolapse: Secondary | ICD-10-CM

## 2020-03-25 NOTE — Progress Notes (Signed)
HPI:      [name] is a 70 y.o. [id number] who presents today for her pessary replacement after a holiday from it.  She had vulvar discomfort and raw area that has resolved.  She desires its replacement for vag pressure sx's.  H/o LS.  Not sexually active.    PMHx: She  has a past medical history of Anxiety, DVT (deep venous thrombosis) ([place]), Hiatal hernia, Hyperlipemia, IBS (irritable bowel syndrome), and Lichen sclerosus (50/5397). Also,  has a past surgical history that includes Breast biopsy (Right, 2002); Ankle surgery (Left, 2010); Shoulder surgery (Right, 2012); Hand surgery (Bilateral, 2014); Fine needle aspiration (Left, 10-01-14); and Breast cyst aspiration (Left, 10/02/2014)., family history includes Diabetes in her father and sister; Hypertension in her father; Pancreatic cancer in her mother.,  reports that she has never smoked. She has never used smokeless tobacco. She reports that she does not drink alcohol and does not use drugs.  She has a current medication list which includes the following prescription(s): alprazolam, clobetasol cream, esomeprazole, fluticasone, and loratadine. Also, is allergic to azithromycin, beclomethasone, doxycycline, erythromycin base, hydrocodone-acetaminophen, lansoprazole, levofloxacin in d5w, penicillin v potassium, prednisone, sulfa antibiotics, amoxicillin, cefuroxime axetil, and codeine.  Review of Systems  All other systems reviewed and are negative.   Objective: BP 140/80   Ht 5\' 1"  (1.549 m)   Wt 165 lb (74.8 kg)   BMI 31.18 kg/m  Physical Exam Constitutional:      General: She is not in acute distress.    Appearance: She is well-developed.  Genitourinary:     Bladder, vagina, uterus and urethral meatus normal.     Right Labia: No lesions.    Left Labia: No lesions.    Vulva exam comments: No bleeding or lesion.     No vaginal erythema or bleeding.     Anterior vaginal prolapse present.    Mild vaginal atrophy present.     Uterus is mobile.     Uterus is midaxial.     Bladder exam comments: Cystocele present.     Pelvic exam was performed with patient in the lithotomy position.  HENT:     Head: Normocephalic and atraumatic.     Nose: Nose normal.  Abdominal:     General: There is no distension.     Palpations: Abdomen is soft.     Tenderness: There is no abdominal tenderness.  Musculoskeletal:        General: Normal range of motion.  Neurological:     Mental Status: She is alert and oriented to person, place, and time.     Cranial Nerves: No cranial nerve deficit.  Skin:    General: Skin is warm and dry.  Psychiatric:        Attention and Perception: Attention normal.        Mood and Affect: Mood and affect normal.        Speech: Speech normal.        Behavior: Behavior normal.        Thought Content: Thought content normal.        Judgment: Judgment normal.     A/P:   ICD-10-CM   1. Cystocele with incomplete uterovaginal prolapse  N81.2   2. Acute vaginitis  N76.0    Pessary was cleaned and placed today following a one month holiday. Instructions given for care. Concerning symptoms to observe for are counseled to patient. Follow up scheduled for 3 months.  A total of 20 minutes were spent  face-to-face with the patient as well as preparation, review, communication, and documentation during this encounter.   Barnett Applebaum, MD, [facility], [facility] 03/25/2020  10:24 AM

## 2020-03-27 ENCOUNTER — Other Ambulatory Visit: Payer: Self-pay | Admitting: General Surgery

## 2020-03-28 LAB — SURGICAL PATHOLOGY

## 2020-03-31 ENCOUNTER — Other Ambulatory Visit: Payer: Self-pay | Admitting: General Surgery

## 2020-03-31 DIAGNOSIS — N6320 Unspecified lump in the left breast, unspecified quadrant: Secondary | ICD-10-CM

## 2020-03-31 DIAGNOSIS — Z1231 Encounter for screening mammogram for malignant neoplasm of breast: Secondary | ICD-10-CM

## 2020-03-31 NOTE — Progress Notes (Signed)
x

## 2020-04-08 NOTE — Addendum Note (Signed)
Addended byHervey Ard on: 04/08/2020 08:54 AM   Modules accepted: Orders

## 2020-04-14 ENCOUNTER — Ambulatory Visit: Payer: Medicare HMO | Admitting: Obstetrics & Gynecology

## 2020-05-08 ENCOUNTER — Ambulatory Visit
Admission: RE | Admit: 2020-05-08 | Discharge: 2020-05-08 | Disposition: A | Discharge status: Home / Self Care | Payer: Medicare HMO | Source: Ambulatory Visit | Attending: General Surgery | Admitting: General Surgery

## 2020-05-08 ENCOUNTER — Other Ambulatory Visit: Payer: Self-pay

## 2020-05-08 DIAGNOSIS — Z1231 Encounter for screening mammogram for malignant neoplasm of breast: Secondary | ICD-10-CM

## 2020-05-27 LAB — COLOGUARD: COLOGUARD: NEGATIVE

## 2020-06-17 ENCOUNTER — Emergency Department: Payer: Medicare HMO

## 2020-06-17 ENCOUNTER — Other Ambulatory Visit: Payer: Self-pay

## 2020-06-17 ENCOUNTER — Emergency Department
Admission: EM | Admit: 2020-06-17 | Discharge: 2020-06-17 | Disposition: A | Discharge status: Home / Self Care | Payer: Medicare HMO | Attending: Emergency Medicine | Admitting: Emergency Medicine

## 2020-06-17 DIAGNOSIS — R531 Weakness: Secondary | ICD-10-CM | POA: Insufficient documentation

## 2020-06-17 DIAGNOSIS — Z20822 Contact with and (suspected) exposure to covid-19: Secondary | ICD-10-CM | POA: Insufficient documentation

## 2020-06-17 DIAGNOSIS — R5383 Other fatigue: Secondary | ICD-10-CM | POA: Insufficient documentation

## 2020-06-17 DIAGNOSIS — R079 Chest pain, unspecified: Secondary | ICD-10-CM | POA: Insufficient documentation

## 2020-06-17 DIAGNOSIS — N390 Urinary tract infection, site not specified: Secondary | ICD-10-CM | POA: Insufficient documentation

## 2020-06-17 DIAGNOSIS — R0789 Other chest pain: Secondary | ICD-10-CM

## 2020-06-17 LAB — COMPREHENSIVE METABOLIC PANEL
ALT: 10 U/L (ref 0–44)
AST: 22 U/L (ref 15–41)
Albumin: 4.3 g/dL (ref 3.5–5.0)
Alkaline Phosphatase: 114 U/L (ref 38–126)
Anion gap: 11 (ref 5–15)
BUN: 20 mg/dL (ref 8–23)
CO2: 24 mmol/L (ref 22–32)
Calcium: 9.5 mg/dL (ref 8.9–10.3)
Chloride: 104 mmol/L (ref 98–111)
Creatinine, Ser: 0.74 mg/dL (ref 0.44–1.00)
GFR, Estimated: >60 mL/min (ref >60)
Glucose, Bld: 151 mg/dL — ABNORMAL HIGH (ref 70–99)
Potassium: 4 mmol/L (ref 3.5–5.1)
Sodium: 139 mmol/L (ref 135–145)
Total Bilirubin: 0.8 mg/dL (ref 0.3–1.2)
Total Protein: 7.8 g/dL (ref 6.5–8.1)

## 2020-06-17 LAB — URINALYSIS, COMPLETE (UACMP) WITH MICROSCOPIC
Bilirubin Urine: NEGATIVE
Glucose, UA: NEGATIVE mg/dL
Hgb urine dipstick: NEGATIVE
Ketones, ur: NEGATIVE mg/dL
Nitrite: NEGATIVE
Protein, ur: NEGATIVE mg/dL
Specific Gravity, Urine: 1.011 (ref 1.005–1.030)
pH: 7 (ref 5.0–8.0)

## 2020-06-17 LAB — CBC
HCT: 41.4 % (ref 36.0–46.0)
Hemoglobin: 13.4 g/dL (ref 12.0–15.0)
MCH: 27.2 pg (ref 26.0–34.0)
MCHC: 32.4 g/dL (ref 30.0–36.0)
MCV: 84.1 fL (ref 80.0–100.0)
Platelets: 309 10*3/uL (ref 150–400)
RBC: 4.92 MIL/uL (ref 3.87–5.11)
RDW: 13 % (ref 11.5–15.5)
WBC: 8.1 10*3/uL (ref 4.0–10.5)
nRBC: 0 % (ref 0.0–0.2)

## 2020-06-17 LAB — TROPONIN I (HIGH SENSITIVITY): Troponin I (High Sensitivity): 3 ng/L (ref <18)

## 2020-06-17 LAB — RESP PANEL BY RT-PCR (FLU A&B, COVID) ARPGX2
Influenza A by PCR: NEGATIVE
Influenza B by PCR: NEGATIVE
SARS Coronavirus 2 by RT PCR: NEGATIVE

## 2020-06-17 MED ORDER — SODIUM CHLORIDE 0.9 % IV BOLUS
1000.0000 mL | Freq: Once | INTRAVENOUS | Status: AC
  Administered 2020-06-17: 1000 mL via INTRAVENOUS

## 2020-06-17 MED ORDER — FOSFOMYCIN TROMETHAMINE 3 G PO PACK
3.0000 g | PACK | Freq: Once | ORAL | Status: AC
  Administered 2020-06-17: 3 g via ORAL
  Filled 2020-06-17: qty 3

## 2020-06-17 NOTE — ED Notes (Signed)
Pt ambulated to toilet to provide urine specimen.

## 2020-06-17 NOTE — ED Provider Notes (Signed)
[facility] Emergency Department Provider Note  Time seen: 9:42 AM  I have reviewed the triage vital signs and the nursing notes.   HISTORY  Chief Complaint Chest Pain   HPI Janet Phillips is a 70 y.o. female with a past medical history of anxiety, DVT, hyperlipidemia, presents to the emergency department for generalized weakness and chest pains.  According to the patient for the past 2 weeks or so she has a sensation of generalized fatigue and weakness that has been progressively worsening.  States she is also been experiencing intermittent pains in the left chest and feeling her heart racing at times.  Denies any cough or shortness of breath.  States some nausea but denies vomiting or diarrhea.  No abdominal pain.  Patient states her main complaint is just generalized weakness sensation.   Past Medical History:  Diagnosis Date  . Anxiety   . DVT (deep venous thrombosis) ([place])   . Hiatal hernia   . Hyperlipemia   . IBS (irritable bowel syndrome)   . Lichen sclerosus 78/2956   on bx    Patient Active Problem List   Diagnosis Date Noted  . Cystocele with incomplete uterovaginal prolapse 06/08/2019  . Vaginal discharge 08/29/2017  . Swelling of limb 12/23/2015  . Lymphedema 12/23/2015  . Iron deficiency anemia 09/24/2015  . Allergic rhinitis 01/21/2015  . Absolute anemia 01/21/2015  . Anxiety 01/21/2015  . Arthritis 01/21/2015  . Deep vein thrombosis (DVT) ([place]) 01/21/2015  . Acid reflux 01/21/2015  . Personal history of other specified conditions 01/21/2015  . H/O acute myocardial infarction 01/21/2015  . HLD (hyperlipidemia) 01/21/2015  . BP (high blood pressure) 01/21/2015  . 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 Disease of thyroid gland 01/21/2015  . Avitaminosis D 01/21/2015  . Cannot sleep 01/21/2015  . Osteopenia 11/21/2014  . Breast mass, left 10/02/2014  . Infection of the upper respiratory tract 02/13/2014  . Edema leg 09/15/2005    Past  Surgical History:  Procedure Laterality Date  . ANKLE SURGERY Left 2010   torn ligament  . BREAST BIOPSY Bilateral 2002   neg/ Dr Orlene Och), right done here- neg   . BREAST BIOPSY Left 2022   [facility]/neg   . BREAST CYST ASPIRATION Left 10/02/2014   cyst asp [facility] 6:00 4cmfn  . FINE NEEDLE ASPIRATION Left 10-01-14   benign  . HAND SURGERY Bilateral 2014  . SHOULDER SURGERY Right 2012   torn ligament    Prior to Admission medications   Medication Sig Start Date End Date Taking? Authorizing Provider  ALPRAZolam Duanne Moron) 0.5 MG tablet Take 0.25 mg by mouth 2 (two) times daily as needed for anxiety.     [provider]  clobetasol cream (TEMOVATE) 2.13 % Apply 1 application topically 2 (two) times daily. Apply as needed, no more than 2 weeks consecutive use. 02/11/20   Gae Dry, MD  esomeprazole (NEXIUM) 40 MG capsule Take 40 mg by mouth 2 (two) times daily. 08/21/15   [provider]  fluticasone (FLONASE) 50 MCG/ACT nasal spray Place into both nostrils as needed for allergies or rhinitis.    [provider]  loratadine (CLARITIN) 10 MG tablet Take 10 mg by mouth daily as needed for allergies.    [provider]    Allergies  Allergen Reactions  . Azithromycin   . Beclomethasone Other (See Comments)  . Doxycycline Nausea Only  . Erythromycin Base Nausea And Vomiting  . Hydrocodone-Acetaminophen Nausea Only and Nausea And Vomiting  .  Lansoprazole Nausea And Vomiting  . Levofloxacin In D5w Nausea Only  . Penicillin V Potassium Hives  . Prednisone Swelling  . Sulfa Antibiotics Other (See Comments)  . Amoxicillin Rash  . Cefuroxime Axetil Rash  . Codeine Palpitations    Family History  Problem Relation Age of Onset  . Pancreatic cancer Mother   . Diabetes Father   . Hypertension Father   . Diabetes Sister     Social History Social History   Tobacco Use  . Smoking status: Never Smoker  . Smokeless tobacco: Never Used   Vaping Use  . Vaping Use: Never used  Substance Use Topics  . Alcohol use: No    Alcohol/week: 0.0 standard drinks  . Drug use: No    Review of Systems Constitutional: Negative for fever.  Positive for generalized fatigue/weakness ENT: Negative for recent illness/congestion Cardiovascular: Negative for chest pain. Respiratory: Negative for shortness of breath. Gastrointestinal: Negative for abdominal pain.  Positive for nausea. Genitourinary: Negative for urinary compaints Musculoskeletal: Negative for musculoskeletal complaints Neurological: Negative for headache All other ROS negative  ____________________________________________   PHYSICAL EXAM:  VITAL SIGNS: ED Triage Vitals  Enc Vitals Group     BP 06/17/20 0912 (!) 187/67     Pulse Rate 06/17/20 0912 (!) 115     Resp 06/17/20 0912 15     Temp 06/17/20 0912 97.9 F (36.6 C)     Temp src --      SpO2 06/17/20 0912 97 %     Weight 06/17/20 0913 160 lb (72.6 kg)     Height 06/17/20 0913 5\' 1"  (1.549 m)     Head Circumference --      Peak Flow --      Pain Score 06/17/20 0910 10     Pain Loc --      Pain Edu? --      Excl. in [place]? --    Constitutional: Alert and oriented. Well appearing and in no distress. Eyes: Normal exam ENT      Head: Normocephalic and atraumatic.      Mouth/Throat: Mucous membranes are moist. Cardiovascular: Normal rate, regular rhythm.  Respiratory: Normal respiratory effort without tachypnea nor retractions. Breath sounds are clear Gastrointestinal: Soft and nontender. No distention. Musculoskeletal: Nontender with normal range of motion in all extremities. Neurologic:  Normal speech and language. No gross focal neurologic deficits  Skin:  Skin is warm, dry and intact.  Psychiatric: Mood and affect are normal.   ____________________________________________    EKG  EKG viewed and interpreted by myself shows sinus tachycardia 114 bpm with a narrow QRS, normal axis, normal intervals,  nonspecific ST changes.  ____________________________________________    RADIOLOGY  Chest x-ray negative  ____________________________________________   INITIAL IMPRESSION / ASSESSMENT AND PLAN / ED COURSE  Pertinent labs & imaging results that were available during my care of the patient were reviewed by me and considered in my medical decision making (see chart for details).   Patient presents emergency department for generalized fatigue weakness intermittent left chest pain ongoing for the past 2 weeks per patient but progressively worsening.  Currently the patient appears well, no distress.  Patient has clear lung sounds but is tachycardic around 120 bpm.  We will IV hydrate, check labs obtain a chest x-ray and a COVID swab and continue to closely monitor.  Differential is quite broad at this time but would include metabolic or electrolyte abnormality, dehydration, infectious etiology such as urinary tract infection, COVID, pneumonia, ACS.  Patient's work-up is overall reassuring does appear to have urinary tract infection.  Has multiple antibiotic allergies we will dose a one-time dose of fosfomycin.  Urine culture has been sent.  The remainder of the patient's work-up is reassuring.  I discussed with the patient increasing oral hydration as the patient's pulse rate is come down significantly with IV fluids currently around 90 bpm.  States she is feeling somewhat better.  We will discharge with PCP follow-up.  Patient agreeable to plan of care.  NAIKA NOTO was evaluated in Emergency Department on 06/17/2020 for the symptoms described in the history of present illness. She was evaluated in the context of the global COVID-19 pandemic, which necessitated consideration that the patient might be at risk for infection with the SARS-CoV-2 virus that causes COVID-19. Institutional protocols and algorithms that pertain to the evaluation of patients at risk for COVID-19 are in a state of rapid  change based on information released by regulatory bodies including the CDC and federal and state organizations. These policies and algorithms were followed during the patient's care in the ED.  ____________________________________________   FINAL CLINICAL IMPRESSION(S) / ED DIAGNOSES  Weakness Chest pain UTI   Harvest Dark, MD 06/17/20 1217

## 2020-06-17 NOTE — ED Triage Notes (Signed)
Pt comes with c/o left sided CP that radiates to back arm and neck. Pt states some SOb. Pt states this started few nights ago.  Pt is tachy at 125  Pt states burning feeling inside.

## 2020-06-18 LAB — URINE CULTURE

## 2020-06-23 ENCOUNTER — Other Ambulatory Visit: Payer: Self-pay

## 2020-06-23 ENCOUNTER — Ambulatory Visit (INDEPENDENT_AMBULATORY_CARE_PROVIDER_SITE_OTHER): Payer: Medicare HMO | Admitting: Obstetrics & Gynecology

## 2020-06-23 ENCOUNTER — Encounter: Payer: Self-pay | Admitting: Obstetrics & Gynecology

## 2020-06-23 VITALS — BP 160/80 | Ht 61.0 in | Wt 158.0 lb

## 2020-06-23 DIAGNOSIS — N812 Incomplete uterovaginal prolapse: Secondary | ICD-10-CM | POA: Diagnosis not present

## 2020-06-23 NOTE — Progress Notes (Signed)
HPI:      [name] is a 70 y.o. [id number] who presents today for her pessary follow up and examination related to her pelvic floor weakening.  Pt reports tolerating the pessary well with no vaginal bleeding and no vaginal discharge.  Symptoms of pelvic floor weakening have greatly improved. She is voiding and defecating without difficulty, although she is getting over a recent UTI as diagnosed in ER due to sx's of dysuria, pain, and anxiety.   She currently has a Ring #3 pessary.  PMHx: She  has a past medical history of Anxiety, DVT (deep venous thrombosis) ([place]), Hiatal hernia, Hyperlipemia, IBS (irritable bowel syndrome), and Lichen sclerosus (28/3151). Also,  has a past surgical history that includes Ankle surgery (Left, 2010); Shoulder surgery (Right, 2012); Hand surgery (Bilateral, 2014); Fine needle aspiration (Left, 10-01-14); Breast cyst aspiration (Left, 10/02/2014); Breast biopsy (Bilateral, 2002); and Breast biopsy (Left, 2022)., family history includes Diabetes in her father and sister; Hypertension in her father; Pancreatic cancer in her mother.,  reports that she has never smoked. She has never used smokeless tobacco. She reports that she does not drink alcohol and does not use drugs.  She has a current medication list which includes the following prescription(s): alprazolam, clobetasol cream, esomeprazole, fluticasone, and loratadine. Also, is allergic to azithromycin, beclomethasone, doxycycline, erythromycin base, hydrocodone-acetaminophen, lansoprazole, levofloxacin in d5w, penicillin v potassium, prednisone, sulfa antibiotics, amoxicillin, cefuroxime axetil, and codeine.  Review of Systems  All other systems reviewed and are negative.   Objective: BP (!) 160/80   Ht 5\' 1"  (1.549 m)   Wt 158 lb (71.7 kg)   BMI 29.85 kg/m  Physical Exam Constitutional:      General: She is not in acute distress.    Appearance: She is well-developed.  Genitourinary:     Bladder and  urethral meatus normal.     Right Labia: No rash or tenderness.    Left Labia: No tenderness or rash.    No vaginal erythema or bleeding.     Anterior and posterior vaginal prolapse present.    Moderate vaginal atrophy present.     Right Adnexa: not tender and no mass present.    Left Adnexa: not tender and no mass present.    No cervical motion tenderness, discharge, polyp or nabothian cyst.     Uterus is prolapsed.     Uterus is not enlarged.     No uterine mass detected.    Uterus exam comments: Uterine prolapse and cystocele present, Gr 3.     Uterus is midaxial.     Pelvic exam was performed with patient in the lithotomy position.  HENT:     Head: Normocephalic and atraumatic.     Nose: Nose normal.  Abdominal:     General: There is no distension.     Palpations: Abdomen is soft.     Tenderness: There is no abdominal tenderness.  Musculoskeletal:        General: Normal range of motion.  Neurological:     Mental Status: She is alert and oriented to person, place, and time.     Cranial Nerves: No cranial nerve deficit.  Skin:    General: Skin is warm and dry.  Psychiatric:        Attention and Perception: Attention normal.        Mood and Affect: Mood and affect normal.        Speech: Speech normal.        Behavior: Behavior  normal.        Thought Content: Thought content normal.        Judgment: Judgment normal.     Pessary Care Pessary removed and cleaned.  Vagina checked - without erosions - pessary replaced.  A/P:   ICD-10-CM   1. Cystocele with incomplete uterovaginal prolapse  N81.2   Pessary was cleaned and replaced today. Instructions given for care. Concerning symptoms to observe for are counseled to patient. Follow up scheduled for 3 months.  A total of 20 minutes were spent face-to-face with the patient as well as preparation, review, communication, and documentation during this encounter.   Barnett Applebaum, MD, [facility], [facility] 06/23/2020  3:27 PM

## 2020-09-01 ENCOUNTER — Ambulatory Visit (INDEPENDENT_AMBULATORY_CARE_PROVIDER_SITE_OTHER): Payer: Medicare HMO | Admitting: Obstetrics & Gynecology

## 2020-09-01 ENCOUNTER — Encounter: Payer: Self-pay | Admitting: Obstetrics & Gynecology

## 2020-09-01 ENCOUNTER — Other Ambulatory Visit: Payer: Self-pay

## 2020-09-01 VITALS — BP 140/80 | Ht 61.0 in | Wt 141.0 lb

## 2020-09-01 DIAGNOSIS — N812 Incomplete uterovaginal prolapse: Secondary | ICD-10-CM

## 2020-09-01 NOTE — Progress Notes (Signed)
HPI:      [name] is a 70 y.o. [id number] who presents today for her pessary follow up and examination related to her pelvic floor weakening.  Pt reports some pain at times w her #3 ring pessary and would like a holiday from it.  She feels she may want hysterectomy/ AR if she continues to need therapy. She has had prior trials without pessary (for bleeding).  She mostly has pressure prior to pessary use.  PMHx: She  has a past medical history of Anxiety, DVT (deep venous thrombosis) ([place]), Hiatal hernia, Hyperlipemia, IBS (irritable bowel syndrome), and Lichen sclerosus (76/1607). Also,  has a past surgical history that includes Ankle surgery (Left, 2010); Shoulder surgery (Right, 2012); Hand surgery (Bilateral, 2014); Fine needle aspiration (Left, 10-01-14); Breast cyst aspiration (Left, 10/02/2014); Breast biopsy (Bilateral, 2002); and Breast biopsy (Left, 2022)., family history includes Diabetes in her father and sister; Hypertension in her father; Pancreatic cancer in her mother.,  reports that she has never smoked. She has never used smokeless tobacco. She reports that she does not drink alcohol and does not use drugs.  She has a current medication list which includes the following prescription(s): alprazolam, clobetasol cream, esomeprazole, fluticasone, and loratadine. Also, is allergic to azithromycin, beclomethasone, doxycycline, erythromycin base, hydrocodone-acetaminophen, lansoprazole, levofloxacin in d5w, penicillin v potassium, prednisone, sulfa antibiotics, amoxicillin, cefuroxime axetil, and codeine.  Review of Systems  All other systems reviewed and are negative.  Objective: BP 140/80   Ht 5\' 1"  (1.549 m)   Wt 141 lb (64 kg)   BMI 26.64 kg/m  Physical Exam Constitutional:      General: She is not in acute distress.    Appearance: She is well-developed.  Genitourinary:     Right Labia: No rash or tenderness.    Left Labia: No tenderness or rash.    No vaginal erythema  or bleeding.      Right Adnexa: not tender and no mass present.    Left Adnexa: not tender and no mass present.    No cervical motion tenderness, discharge, polyp or nabothian cyst.     Uterus is not enlarged.     No uterine mass detected.    Uterus exam comments: Small uterus, no ext expulsion (Gr 1) Cystocele Gr 2.     Pelvic exam was performed with patient in the lithotomy position.  HENT:     Head: Normocephalic and atraumatic.     Nose: Nose normal.  Abdominal:     General: There is no distension.     Palpations: Abdomen is soft.     Tenderness: There is no abdominal tenderness.  Musculoskeletal:        General: Normal range of motion.  Neurological:     Mental Status: She is alert and oriented to person, place, and time.     Cranial Nerves: No cranial nerve deficit.  Skin:    General: Skin is warm and dry.  Psychiatric:        Attention and Perception: Attention normal.        Mood and Affect: Mood and affect normal.        Speech: Speech normal.        Behavior: Behavior normal.        Thought Content: Thought content normal.        Judgment: Judgment normal.    Pessary Care Pessary removed and cleaned.  Vagina checked - without erosions - pessary replaced.  A/P:   ICD-10-CM  1. Cystocele with incomplete uterovaginal prolapse  N81.2      Pessary holiday per pt request.  Will leave out and follow symptoms closely.  Replace or consider surgical options in 4 weeks.  A total of 20 minutes were spent face-to-face with the patient as well as preparation, review, communication, and documentation during this encounter.   Barnett Applebaum, MD, [facility], [facility] 09/01/2020  2:43 PM

## 2020-09-10 ENCOUNTER — Other Ambulatory Visit: Payer: Self-pay | Admitting: Internal Medicine

## 2020-09-10 DIAGNOSIS — R634 Abnormal weight loss: Secondary | ICD-10-CM

## 2020-09-10 DIAGNOSIS — R1084 Generalized abdominal pain: Secondary | ICD-10-CM

## 2020-09-18 ENCOUNTER — Encounter: Payer: Self-pay | Admitting: *Deleted

## 2020-09-19 ENCOUNTER — Ambulatory Visit: Payer: Medicare HMO | Admitting: Anesthesiology

## 2020-09-19 ENCOUNTER — Ambulatory Visit
Admission: RE | Admit: 2020-09-19 | Discharge: 2020-09-19 | Disposition: A | Discharge status: Home / Self Care | Payer: Medicare HMO | Attending: Gastroenterology | Admitting: Gastroenterology

## 2020-09-19 ENCOUNTER — Encounter
Admission: RE | Disposition: A | Discharge status: Home / Self Care | Payer: Self-pay | Source: Home / Self Care | Attending: Gastroenterology

## 2020-09-19 DIAGNOSIS — R634 Abnormal weight loss: Secondary | ICD-10-CM | POA: Diagnosis not present

## 2020-09-19 DIAGNOSIS — K449 Diaphragmatic hernia without obstruction or gangrene: Secondary | ICD-10-CM | POA: Insufficient documentation

## 2020-09-19 DIAGNOSIS — K319 Disease of stomach and duodenum, unspecified: Secondary | ICD-10-CM | POA: Diagnosis not present

## 2020-09-19 DIAGNOSIS — Z86718 Personal history of other venous thrombosis and embolism: Secondary | ICD-10-CM | POA: Diagnosis not present

## 2020-09-19 DIAGNOSIS — Z888 Allergy status to other drugs, medicaments and biological substances status: Secondary | ICD-10-CM | POA: Diagnosis not present

## 2020-09-19 DIAGNOSIS — K589 Irritable bowel syndrome without diarrhea: Secondary | ICD-10-CM | POA: Insufficient documentation

## 2020-09-19 DIAGNOSIS — Z79899 Other long term (current) drug therapy: Secondary | ICD-10-CM | POA: Diagnosis not present

## 2020-09-19 DIAGNOSIS — Z881 Allergy status to other antibiotic agents status: Secondary | ICD-10-CM | POA: Insufficient documentation

## 2020-09-19 DIAGNOSIS — Z880 Allergy status to penicillin: Secondary | ICD-10-CM | POA: Diagnosis not present

## 2020-09-19 DIAGNOSIS — Z885 Allergy status to narcotic agent status: Secondary | ICD-10-CM | POA: Insufficient documentation

## 2020-09-19 DIAGNOSIS — E785 Hyperlipidemia, unspecified: Secondary | ICD-10-CM | POA: Insufficient documentation

## 2020-09-19 DIAGNOSIS — R1012 Left upper quadrant pain: Secondary | ICD-10-CM | POA: Diagnosis present

## 2020-09-19 DIAGNOSIS — Z882 Allergy status to sulfonamides status: Secondary | ICD-10-CM | POA: Insufficient documentation

## 2020-09-19 DIAGNOSIS — K219 Gastro-esophageal reflux disease without esophagitis: Secondary | ICD-10-CM | POA: Insufficient documentation

## 2020-09-19 HISTORY — PX: ESOPHAGOGASTRODUODENOSCOPY (EGD) WITH PROPOFOL: SHX5813

## 2020-09-19 HISTORY — DX: Unspecified osteoarthritis, unspecified site: M19.90

## 2020-09-19 HISTORY — DX: Gastro-esophageal reflux disease without esophagitis: K21.9

## 2020-09-19 HISTORY — DX: Anemia, unspecified: D64.9

## 2020-09-19 SURGERY — ESOPHAGOGASTRODUODENOSCOPY (EGD) WITH PROPOFOL
Anesthesia: General

## 2020-09-19 MED ORDER — PROPOFOL 500 MG/50ML IV EMUL
INTRAVENOUS | Status: DC | PRN
  Administered 2020-09-19: 125 ug/kg/min via INTRAVENOUS

## 2020-09-19 MED ORDER — PROPOFOL 10 MG/ML IV BOLUS
INTRAVENOUS | Status: DC | PRN
  Administered 2020-09-19: 50 mg via INTRAVENOUS

## 2020-09-19 MED ORDER — LIDOCAINE HCL (CARDIAC) PF 100 MG/5ML IV SOSY
PREFILLED_SYRINGE | INTRAVENOUS | Status: DC | PRN
  Administered 2020-09-19: 30 mg via INTRAVENOUS

## 2020-09-19 MED ORDER — LIDOCAINE HCL (PF) 2 % IJ SOLN
INTRAMUSCULAR | Status: AC
  Filled 2020-09-19: qty 5

## 2020-09-19 MED ORDER — SODIUM CHLORIDE 0.9 % IV SOLN
INTRAVENOUS | Status: DC
  Administered 2020-09-19: 20 mL/h via INTRAVENOUS

## 2020-09-19 MED ORDER — PROPOFOL 500 MG/50ML IV EMUL
INTRAVENOUS | Status: AC
  Filled 2020-09-19: qty 50

## 2020-09-19 NOTE — Transfer of Care (Signed)
Immediate Anesthesia Transfer of Care Note  Patient: Janet Phillips  Procedure(s) Performed: ESOPHAGOGASTRODUODENOSCOPY (EGD) WITH PROPOFOL  Patient Location: PACU and Endoscopy Unit  Anesthesia Type:General  Level of Consciousness: awake  Airway & Oxygen Therapy: Patient Spontanous Breathing  Post-op Assessment: Report given to RN  Post vital signs: stable  Last Vitals:  Vitals Value Taken Time  BP 110/53 09/19/20 1155  Temp    Pulse 85 09/19/20 1157  Resp 21 09/19/20 1157  SpO2 97 % 09/19/20 1157  Vitals shown include unvalidated device data.  Last Pain:  Vitals:   09/19/20 1150  TempSrc: Temporal  PainSc:          Complications: No notable events documented.

## 2020-09-19 NOTE — Anesthesia Postprocedure Evaluation (Signed)
Anesthesia Post Note  Patient: Janet Phillips  Procedure(s) Performed: ESOPHAGOGASTRODUODENOSCOPY (EGD) WITH PROPOFOL  Patient location during evaluation: Endoscopy Anesthesia Type: General Level of consciousness: awake and alert Pain management: pain level controlled Vital Signs Assessment: post-procedure vital signs reviewed and stable Respiratory status: spontaneous breathing, nonlabored ventilation, respiratory function stable and patient connected to nasal cannula oxygen Cardiovascular status: blood pressure returned to baseline and stable Postop Assessment: no apparent nausea or vomiting Anesthetic complications: no   No notable events documented.   Last Vitals:  Vitals:   09/19/20 1200 09/19/20 1210  BP: 130/62 134/79  Pulse: 82 85  Resp: 17 17  Temp:    SpO2: 96% 97%    Last Pain:  Vitals:   09/19/20 1210  TempSrc:   PainSc: 0-No pain                 Arita Miss

## 2020-09-19 NOTE — Interval H&P Note (Signed)
History and Physical Interval Note:  09/19/2020 11:41 AM  Janet Phillips  has presented today for surgery, with the diagnosis of LUQ pain (R10.12) Gastroesophageal reflux disease, unspecified whether esophagitis present (K21.9).  The various methods of treatment have been discussed with the patient and family. After consideration of risks, benefits and other options for treatment, the patient has consented to  Procedure(s): ESOPHAGOGASTRODUODENOSCOPY (EGD) WITH PROPOFOL (N/A) as a surgical intervention.  The patient's history has been reviewed, patient examined, no change in status, stable for surgery.  I have reviewed the patient's chart and labs.  Questions were answered to the patient's satisfaction.     Lesly Rubenstein  Ok to proceed with EGD

## 2020-09-19 NOTE — H&P (Signed)
Outpatient short stay form Pre-procedure 09/19/2020 11:39 AM Janet Miyamoto MD, MPH  Primary Physician: Dr. Caryl Comes  Reason for visit:  LUQ pain  History of present illness:   70 y/o lady with anxiety here with LUQ pain and weight loss. No blood thinners. No family history of GU malignancies. History of abdominal laparotomy for enodmetriosis.    Current Facility-Administered Medications:    0.9 %  sodium chloride infusion, , Intravenous, Continuous, Kimberl Vig, Hilton Cork, MD, Last Rate: 20 mL/hr at 09/19/20 1137, Continued from Pre-op at 09/19/20 1137  Medications Prior to Admission  Medication Sig Dispense Refill Last Dose   ALPRAZolam (XANAX) 0.5 MG tablet Take 0.25 mg by mouth 2 (two) times daily as needed for anxiety.    09/19/2020 at 0630   clobetasol cream (TEMOVATE) [id number] % Apply 1 application topically 2 (two) times daily. Apply as needed, no more than 2 weeks consecutive use. 30 g 2 09/18/2020   fluticasone (FLONASE) 50 MCG/ACT nasal spray Place into both nostrils as needed for allergies or rhinitis.   09/18/2020   loratadine (CLARITIN) 10 MG tablet Take 10 mg by mouth daily as needed for allergies.   09/18/2020   mirtazapine (REMERON) 7.5 MG tablet Take 7.5 mg by mouth at bedtime.   09/18/2020   montelukast (SINGULAIR) 10 MG tablet Take 10 mg by mouth at bedtime.   09/18/2020   pantoprazole (PROTONIX) 40 MG tablet Take 40 mg by mouth 2 (two) times daily before a meal.   09/18/2020   esomeprazole (NEXIUM) 40 MG capsule Take 40 mg by mouth 2 (two) times daily.        Allergies  Allergen Reactions   Azithromycin    Beclomethasone Other (See Comments)   Doxycycline Nausea Only   Erythromycin Base Nausea And Vomiting   Hydrocodone-Acetaminophen Nausea Only and Nausea And Vomiting   Lansoprazole Nausea And Vomiting   Levofloxacin In D5w Nausea Only   Penicillin V Potassium Hives   Prednisone Swelling   Sulfa Antibiotics Other (See Comments)   Amoxicillin Rash   Cefuroxime Axetil Rash    Codeine Palpitations     Past Medical History:  Diagnosis Date   Anemia    Anxiety    Arthritis    DVT (deep venous thrombosis) ([facility])    GERD (gastroesophageal reflux disease)    Hiatal hernia    Hyperlipemia    IBS (irritable bowel syndrome)    Lichen sclerosus [id number]   on bx    Review of systems:  Otherwise negative.    Physical Exam  Gen: Alert, oriented. Appears stated age.  HEENT: PERRLA. Lungs: No respiratory distress CV: RRR Abd: soft, benign, no masses Ext: No edema    Planned procedures: Proceed with EGD The patient understands the nature of the planned procedure, indications, risks, alternatives and potential complications including but not limited to bleeding, infection, perforation, damage to internal organs and possible oversedation/side effects from anesthesia. The patient agrees and gives consent to proceed.  Please refer to procedure notes for findings, recommendations and patient disposition/instructions.     Janet Miyamoto MD, MPH Gastroenterology 09/19/2020  11:39 AM

## 2020-09-19 NOTE — Anesthesia Preprocedure Evaluation (Signed)
Anesthesia Evaluation  Patient identified by MRN, date of birth, ID band Patient awake    Reviewed: Allergy & Precautions, NPO status , Patient's Chart, lab work & pertinent test results  History of Anesthesia Complications Negative for: history of anesthetic complications  Airway Mallampati: II  TM Distance: >3 FB Neck ROM: Full    Dental no notable dental hx. (+) Teeth Intact   Pulmonary neg pulmonary ROS, neg sleep apnea, neg COPD, Patient abstained from smoking.Not current smoker,    Pulmonary exam normal breath sounds clear to auscultation       Cardiovascular Exercise Tolerance: Good METShypertension, Pt. on medications (-) CAD and (-) Past MI (-) dysrhythmias  Rhythm:Regular Rate:Normal - Systolic murmurs    Neuro/Psych PSYCHIATRIC DISORDERS Anxiety negative neurological ROS     GI/Hepatic hiatal hernia, GERD  ,(+)     (-) substance abuse  ,   Endo/Other  neg diabetes  Renal/GU negative Renal ROS     Musculoskeletal  (+) Arthritis ,   Abdominal   Peds  Hematology   Anesthesia Other Findings Past Medical History: No date: Anemia No date: Anxiety No date: Arthritis No date: DVT (deep venous thrombosis) ([facility]) No date: GERD (gastroesophageal reflux disease) No date: Hiatal hernia No date: Hyperlipemia No date: IBS (irritable bowel syndrome) [id number]: Lichen sclerosus     Comment:  on bx  Reproductive/Obstetrics                             Anesthesia Physical Anesthesia Plan  ASA: 2  Anesthesia Plan: General   Post-op Pain Management:    Induction: Intravenous  PONV Risk Score and Plan: 3 and Ondansetron, Propofol infusion and TIVA  Airway Management Planned: Nasal Cannula  Additional Equipment: None  Intra-op Plan:   Post-operative Plan:   Informed Consent: I have reviewed the patients History and Physical, chart, labs and discussed the procedure including the  risks, benefits and alternatives for the proposed anesthesia with the patient or authorized representative who has indicated his/her understanding and acceptance.     Dental advisory given  Plan Discussed with: CRNA and Surgeon  Anesthesia Plan Comments: (Discussed risks of anesthesia with patient, including possibility of difficulty with spontaneous ventilation under anesthesia necessitating airway intervention, PONV, and rare risks such as cardiac or respiratory or neurological events, and allergic reactions. Patient understands.)        Anesthesia Quick Evaluation

## 2020-09-19 NOTE — Op Note (Signed)
[facility] Gastroenterology Patient Name: Janet Phillips Procedure Date: 09/19/2020 11:25 AM MRN: [id number] Account #: [id number] Date of Birth: 1950-10-19 Admit Type: Outpatient Age: 70 Room: [facility] ENDO ROOM 3 Gender: Female Note Status: Finalized Procedure:             Upper GI endoscopy Indications:           Abdominal pain in the left upper quadrant, Weight loss Providers:             Andrey Farmer MD, MD Medicines:             Monitored Anesthesia Care Complications:         No immediate complications. Estimated blood loss:                         Minimal. Procedure:             Pre-Anesthesia Assessment:                        - Prior to the procedure, a History and Physical was                         performed, and patient medications and allergies were                         reviewed. The patient is competent. The risks and                         benefits of the procedure and the sedation options and                         risks were discussed with the patient. All questions                         were answered and informed consent was obtained.                         Patient identification and proposed procedure were                         verified by the physician, the nurse, the anesthetist                         and the technician in the endoscopy suite. Mental                         Status Examination: alert and oriented. Airway                         Examination: normal oropharyngeal airway and neck                         mobility. Respiratory Examination: clear to                         auscultation. CV Examination: normal. Prophylactic                         Antibiotics: The patient does not require prophylactic  antibiotics. Prior Anticoagulants: The patient has                         taken no previous anticoagulant or antiplatelet                         agents. ASA Grade Assessment: III - A patient with                          severe systemic disease. After reviewing the risks and                         benefits, the patient was deemed in satisfactory                         condition to undergo the procedure. The anesthesia                         plan was to use monitored anesthesia care (MAC).                         Immediately prior to administration of medications,                         the patient was re-assessed for adequacy to receive                         sedatives. The heart rate, respiratory rate, oxygen                         saturations, blood pressure, adequacy of pulmonary                         ventilation, and response to care were monitored                         throughout the procedure. The physical status of the                         patient was re-assessed after the procedure.                        After obtaining informed consent, the endoscope was                         passed under direct vision. Throughout the procedure,                         the patient's blood pressure, pulse, and oxygen                         saturations were monitored continuously. The Endoscope                         was introduced through the mouth, and advanced to the                         second part of duodenum. The upper GI endoscopy was  accomplished without difficulty. The patient tolerated                         the procedure well. Findings:      A small hiatal hernia was present.      The exam of the esophagus was otherwise normal.      The entire examined stomach was normal. Biopsies were taken with a cold       forceps for Helicobacter pylori testing. Estimated blood loss was       minimal.      The examined duodenum was normal. Impression:            - Small hiatal hernia.                        - Normal stomach. Biopsied.                        - Normal examined duodenum. Recommendation:        - Discharge patient to home.                         - Resume previous diet.                        - Continue present medications.                        - Await pathology results.                        - Return to referring physician as previously                         scheduled. Procedure Code(s):     --- Professional ---                        [phone], Esophagogastroduodenoscopy, flexible,                         transoral; with biopsy, single or multiple Diagnosis Code(s):     --- Professional ---                        K44.9, Diaphragmatic hernia without obstruction or                         gangrene                        R10.12, Left upper quadrant pain                        R63.4, Abnormal weight loss CPT copyright 2019 American Medical Association. All rights reserved. The codes documented in this report are preliminary and upon coder review may  be revised to meet current compliance requirements. Andrey Farmer MD, MD 09/19/2020 11:55:53 AM Number of Addenda: 0 Note Initiated On: 09/19/2020 11:25 AM Estimated Blood Loss:  Estimated blood loss was minimal.      [facility]

## 2020-09-22 ENCOUNTER — Encounter: Payer: Self-pay | Admitting: Gastroenterology

## 2020-09-22 LAB — SURGICAL PATHOLOGY

## 2020-09-25 ENCOUNTER — Ambulatory Visit: Payer: Medicare HMO | Admitting: Obstetrics & Gynecology

## 2020-09-26 ENCOUNTER — Ambulatory Visit: Admission: RE | Admit: 2020-09-26 | Payer: Medicare HMO | Source: Ambulatory Visit

## 2020-10-13 ENCOUNTER — Ambulatory Visit: Payer: Medicare HMO | Admitting: Obstetrics & Gynecology

## 2020-10-28 ENCOUNTER — Encounter: Payer: Self-pay | Admitting: Obstetrics & Gynecology

## 2020-10-28 ENCOUNTER — Other Ambulatory Visit: Payer: Self-pay

## 2020-10-28 ENCOUNTER — Ambulatory Visit (INDEPENDENT_AMBULATORY_CARE_PROVIDER_SITE_OTHER): Payer: Medicare HMO | Admitting: Obstetrics & Gynecology

## 2020-10-28 VITALS — BP 120/80 | Ht 63.0 in | Wt 134.0 lb

## 2020-10-28 DIAGNOSIS — N812 Incomplete uterovaginal prolapse: Secondary | ICD-10-CM | POA: Diagnosis not present

## 2020-10-28 NOTE — Patient Instructions (Signed)
Total Laparoscopic Hysterectomy A total laparoscopic hysterectomy is a minimally invasive surgery to remove the uterus and cervix. The fallopian tubes and ovaries can also be removed during this surgery, if necessary. This procedure may be done to treat problems such as: Growths in the uterus (uterine fibroids) that are not cancer but cause symptoms. A condition that causes the lining of the uterus to grow in other areas (endometriosis). Problems with pelvic support. Cancer of the cervix, ovaries, uterus, or tissue that lines the uterus (endometrium). Excessive bleeding in the uterus. After this procedure, you will no longer be able to have a baby, and you will no longer have a menstrual period. Tell a health care provider about: Any allergies you have. All medicines you are taking, including vitamins, herbs, eye drops, creams, and over-the-counter medicines. Any problems you or family members have had with anesthetic medicines. Any blood disorders you have. Any surgeries you have had. Any medical conditions you have. Whether you are pregnant or may be pregnant. What are the risks? Generally, this is a safe procedure. However, problems may occur, including: Infection. Bleeding. Blood clots in the legs or lungs. Allergic reactions to medicines. Damage to nearby structures or organs. Having to change from this surgery to one in which a large incision is made in the abdomen (abdominal hysterectomy). What happens before the procedure? Staying hydrated Follow instructions from your health care provider about hydration, which may include: Up to 2 hours before the procedure - you may continue to drink clear liquids, such as water, clear fruit juice, black coffee, and plain tea.  Eating and drinking restrictions Follow instructions from your health care provider about eating and drinking, which may include: 8 hours before the procedure - stop eating heavy meals or foods, such as meat, fried  foods, or fatty foods. 6 hours before the procedure - stop eating light meals or foods, such as toast or cereal. 6 hours before the procedure - stop drinking milk or drinks that contain milk. 2 hours before the procedure - stop drinking clear liquids. Medicines Ask your health care provider about: Changing or stopping your regular medicines. This is especially important if you are taking diabetes medicines or blood thinners. Taking medicines such as aspirin and ibuprofen. These medicines can thin your blood. Do not take these medicines unless your health care provider tells you to take them. Taking over-the-counter medicines, vitamins, herbs, and supplements. You may be asked to take medicine that helps you have a bowel movement (laxative) to prevent constipation. General instructions If you were asked to do bowel preparation before the procedure, follow instructions from your health care provider. This procedure can affect the way you feel about yourself. Talk with your health care provider about the physical and emotional changes hysterectomy may cause. Do not use any products that contain nicotine or tobacco for at least 4 weeks before the procedure. These products include cigarettes, chewing tobacco, and vaping devices, such as e-cigarettes. If you need help quitting, ask your health care provider. Plan to have a responsible adult take you home from the hospital or clinic. Plan to have a responsible adult care for you for the time you are told after you leave the hospital or clinic. This is important. Surgery safety Ask your health care provider: How your surgery site will be marked. What steps will be taken to help prevent infection. These may include: Removing hair at the surgery site. Washing skin with a germ-killing soap. Receiving antibiotic medicine. What happens during the  procedure? An IV will be inserted into one of your veins. You will be given one or more of the following: A  medicine to help you relax (sedative). A medicine to make you fall asleep (general anesthetic). A medicine to numb the area (local anesthetic). A medicine that is injected into your spine to numb the area below and slightly above the injection site (spinal anesthetic). A medicine that is injected into an area of your body to numb everything below the injection site (regional anesthetic). A gas will be used to inflate your abdomen. This will allow your surgeon to look inside your abdomen and do the surgery. Three or four small incisions will be made in your abdomen. A small device with a light (laparoscope) will be inserted into one of your incisions. Surgical instruments will be inserted through the other incisions in order to perform the procedure. Your uterus and cervix may be removed through your vagina or cut into small pieces and removed through the small incisions. Any other organs that need to be removed will also be removed this way. The gas will be released from inside your abdomen. Your incisions will be closed with stitches (sutures), skin glue, or adhesive strips. A bandage (dressing) may be placed over your incisions. The procedure may vary among health care providers and hospitals. What happens after the procedure? Your blood pressure, heart rate, breathing rate, and blood oxygen level will be monitored until you leave the hospital or clinic. You will be given medicine for pain as needed. You will be encouraged to walk as soon as possible. You will also use a device to help you breathe or do breathing exercises to keep your lungs clear. You may have to wear compression stockings. These stockings help to prevent blood clots and reduce swelling in your legs. You will need to wear a sanitary pad for vaginal discharge or bleeding. Summary Total laparoscopic hysterectomy is a procedure to remove your uterus, cervix, and sometimes the fallopian tubes and ovaries. This procedure can  affect the way you feel about yourself. Talk with your health care provider about the physical and emotional changes hysterectomy may cause. After this procedure, you will no longer be able to have a baby, and you will no longer have a menstrual period. You will be given pain medicine to control discomfort after this procedure. Plan to have a responsible adult take you home from the hospital or clinic. This information is not intended to replace advice given to you by your health care provider. Make sure you discuss any questions you have with your health care provider. Document Revised: 10/05/2019 Document Reviewed: 10/05/2019 Elsevier Patient Education  [place].

## 2020-10-28 NOTE — Progress Notes (Signed)
Cystocele/Rectocele Patient is a 70 yo WF G3P2 who complains of continued sx's of uterine prolapse and cystocele. Problem started several months ago. Symptoms include: prolapse of tissue with straining and discomfort: pressure sensation . No incontinence.  Pressure worse w BMs.  Symptoms have  been somewhat stable since removal of pessary.  She did not like pessary and also had some bleeding.  She prefers not to use pessary again in future.  She feels her s'xs are not severe enough to consider surgery yet, but is interested in her options and what to expect.   PMHx: She  has a past medical history of Anemia, Anxiety, Arthritis, DVT (deep venous thrombosis) (HCC), GERD (gastroesophageal reflux disease), Hiatal hernia, Hyperlipemia, IBS (irritable bowel syndrome), and Lichen sclerosus ([id number]). Also,  has a past surgical history that includes Ankle surgery (Left, 2010); Shoulder surgery (Right, 2012); Hand surgery (Bilateral, 2014); Fine needle aspiration (Left, 10-01-14); Breast cyst aspiration (Left, 10/02/2014); Breast biopsy (Bilateral, 2002); Breast biopsy (Left, 2022); and Esophagogastroduodenoscopy (egd) with propofol (N/A, 09/19/2020)., family history includes Diabetes in her father and sister; Hypertension in her father; Pancreatic cancer in her mother.,  reports that she has never smoked. She has never used smokeless tobacco. She reports that she does not drink alcohol and does not use drugs.  She has a current medication list which includes the following prescription(s): alprazolam, clobetasol cream, esomeprazole, fluticasone, loratadine, mirtazapine, montelukast, and pantoprazole. Also, is allergic to azithromycin, beclomethasone, doxycycline, erythromycin base, hydrocodone-acetaminophen, lansoprazole, levofloxacin in d5w, penicillin v potassium, prednisone, sulfa antibiotics, amoxicillin, cefuroxime axetil, and codeine.  Review of Systems  Constitutional:  Negative for chills, fever and  malaise/fatigue.  HENT:  Negative for congestion, sinus pain and sore throat.   Eyes:  Negative for blurred vision and pain.  Respiratory:  Negative for cough and wheezing.   Cardiovascular:  Negative for chest pain and leg swelling.  Gastrointestinal:  Negative for abdominal pain, constipation, diarrhea, heartburn, nausea and vomiting.  Genitourinary:  Positive for frequency. Negative for dysuria, hematuria and urgency.  Musculoskeletal:  Negative for back pain, joint pain, myalgias and neck pain.  Skin:  Negative for itching and rash.  Neurological:  Negative for dizziness, tremors and weakness.  Endo/Heme/Allergies:  Does not bruise/bleed easily.  Psychiatric/Behavioral:  Negative for depression. The patient is not nervous/anxious and does not have insomnia.    Objective: BP 120/80   Ht '5\' 3"'$  (1.6 m)   Wt 134 lb (60.8 kg)   BMI 23.74 kg/m  Physical Exam Constitutional:      General: She is not in acute distress.    Appearance: She is well-developed.  Musculoskeletal:        General: Normal range of motion.  Neurological:     Mental Status: She is alert and oriented to person, place, and time.  Skin:    General: Skin is warm and dry.  Vitals reviewed.    ASSESSMENT/PLAN:   Problem List Items Addressed This Visit       Genitourinary   Cystocele with incomplete uterovaginal prolapse - Primary  Options for pessary, surgery (hyst, AR) and no therapy discussed Surgery would be TLH BSO AR based on prior exams She prefers no pessary  as therapy in future Will cont to monitor sx's Husband present to aid discussion  A total of 20 minutes were spent face-to-face with the patient as well as preparation, review, communication, and documentation during this encounter.   Barnett Applebaum, MD, [facility], [facility] 10/28/2020  9:56  AM

## 2020-12-12 ENCOUNTER — Ambulatory Visit (INDEPENDENT_AMBULATORY_CARE_PROVIDER_SITE_OTHER): Payer: Medicare HMO | Admitting: Obstetrics & Gynecology

## 2020-12-12 ENCOUNTER — Other Ambulatory Visit: Payer: Self-pay

## 2020-12-12 ENCOUNTER — Encounter: Payer: Self-pay | Admitting: Obstetrics & Gynecology

## 2020-12-12 VITALS — BP 130/80 | Ht 61.0 in | Wt 132.0 lb

## 2020-12-12 DIAGNOSIS — L900 Lichen sclerosus et atrophicus: Secondary | ICD-10-CM

## 2020-12-12 DIAGNOSIS — N812 Incomplete uterovaginal prolapse: Secondary | ICD-10-CM

## 2020-12-12 NOTE — Progress Notes (Signed)
HPI:      [name] is a 70 y.o. [id number] who presents today for her symptoms of pelvic floor weakening, which have worsened since having the pessary holiday.  Pessary out since July.  She had some bleeding and discomfort w pessary in past.  Did well for a while but now feels vaginal pressure from uterine prolapse.  She is voiding and defecating without difficulty. She in past has had a #3Ring pessary.  PMHx: She  has a past medical history of Anemia, Anxiety, Arthritis, DVT (deep venous thrombosis) (HCC), GERD (gastroesophageal reflux disease), Hiatal hernia, Hyperlipemia, IBS (irritable bowel syndrome), and Lichen sclerosus (76/7209). Also,  has a past surgical history that includes Ankle surgery (Left, 2010); Shoulder surgery (Right, 2012); Hand surgery (Bilateral, 2014); Fine needle aspiration (Left, 10-01-14); Breast cyst aspiration (Left, 10/02/2014); Breast biopsy (Bilateral, 2002); Breast biopsy (Left, 2022); and Esophagogastroduodenoscopy (egd) with propofol (N/A, 09/19/2020)., family history includes Diabetes in her father and sister; Hypertension in her father; Pancreatic cancer in her mother.,  reports that she has never smoked. She has never used smokeless tobacco. She reports that she does not drink alcohol and does not use drugs.  She has a current medication list which includes the following prescription(s): alprazolam, clobetasol cream, esomeprazole, fluticasone, loratadine, mirtazapine, montelukast, and pantoprazole. Also, is allergic to azithromycin, beclomethasone, doxycycline, erythromycin base, hydrocodone-acetaminophen, lansoprazole, levofloxacin in d5w, penicillin v potassium, prednisone, sulfa antibiotics, amoxicillin, cefuroxime axetil, and codeine.  Review of Systems  All other systems reviewed and are negative.  Objective: BP 130/80   Ht 5\' 1"  (1.549 m)   Wt 132 lb (59.9 kg)   BMI 24.94 kg/m  Physical Exam Constitutional:      General: She is not in acute  distress.    Appearance: She is well-developed.  Genitourinary:     Genitourinary Comments: Skin changes c/w lichen mild.     Right Labia: No rash or tenderness.    Left Labia: No tenderness or rash.    No vaginal erythema or bleeding.     Mild vaginal atrophy present.     Right Adnexa: not tender and no mass present.    Left Adnexa: not tender and no mass present.    No cervical motion tenderness, discharge, polyp or nabothian cyst.     Uterus is prolapsed.     Uterus is not enlarged.     No uterine mass detected.    Uterus exam comments: Gr 2 uterine prolapse and gr 1 cystocele.     Pelvic exam was performed with patient in the lithotomy position.  HENT:     Head: Normocephalic and atraumatic.     Nose: Nose normal.  Abdominal:     General: There is no distension.     Palpations: Abdomen is soft.     Tenderness: There is no abdominal tenderness.  Musculoskeletal:        General: Normal range of motion.  Neurological:     Mental Status: She is alert and oriented to person, place, and time.     Cranial Nerves: No cranial nerve deficit.  Skin:    General: Skin is warm and dry.  Psychiatric:        Attention and Perception: Attention normal.        Mood and Affect: Mood and affect normal.        Speech: Speech normal.        Behavior: Behavior normal.        Thought Content: Thought content  normal.        Judgment: Judgment normal.    Pessary Care Vagina checked - without erosions - pessary ring #3 placed.  A/P:   ICD-10-CM   1. Cystocele with incomplete uterovaginal prolapse  N81.2     2. Lichen sclerosus  T53.2      Pessary was cleaned and replaced today.  Desires this approach again; discussed alternative of surgery for prolapse (hyst, AR). Instructions given for care. Concerning symptoms to observe for are counseled to patient. Follow up scheduled for 3 months.  A total of 20 minutes were spent face-to-face with the patient as well as preparation, review,  communication, and documentation during this encounter.   Barnett Applebaum, MD, [facility], [facility] 12/12/2020  8:40 AM

## 2021-01-06 ENCOUNTER — Telehealth: Payer: Self-pay

## 2021-01-06 NOTE — Telephone Encounter (Signed)
Pt wants her pessary out, I advised her that she had a ring and could take it out her self. She does not want to do that, Your schedule looks booked for tomorrow, Please advise

## 2021-01-06 NOTE — Telephone Encounter (Signed)
Patient is requesting to be seen by Dr Kenton Kingfisher about her pessary. Patient is aware Steele is out of the office message with be sent to his nurse. Please advise Work in for Wednesday?

## 2021-01-06 NOTE — Telephone Encounter (Signed)
OK to work in [employer] after 230

## 2021-01-06 NOTE — Telephone Encounter (Signed)
Contacted patient's husband on DPR. Patient is scheduled for 2:30 with the arrival of 2:15 pm on Wednesday, 01/06/21

## 2021-01-07 ENCOUNTER — Ambulatory Visit (INDEPENDENT_AMBULATORY_CARE_PROVIDER_SITE_OTHER): Payer: Medicare HMO | Admitting: Obstetrics & Gynecology

## 2021-01-07 ENCOUNTER — Other Ambulatory Visit: Payer: Self-pay

## 2021-01-07 ENCOUNTER — Encounter: Payer: Self-pay | Admitting: Obstetrics & Gynecology

## 2021-01-07 VITALS — BP 154/70 | Ht 62.0 in | Wt 126.0 lb

## 2021-01-07 DIAGNOSIS — N812 Incomplete uterovaginal prolapse: Secondary | ICD-10-CM | POA: Diagnosis not present

## 2021-01-07 NOTE — Progress Notes (Signed)
  HPI:      [name] is a 70 y.o. [id number] who presents today for her follow up and examination related to her pelvic floor weakening.  She does NOT tolerate the pessary well, and desires alternative treatment.  She has pelvic and vaginal pressure.  At times the uterus completely prolapses and she has to push back in.  Minimal GSI.  She has constipation and having BMs is difficult at times ad the pessary always extrudes or bothers her w BMs.  She removed her pessary yesterday  PMHx: She  has a past medical history of Anemia, Anxiety, Arthritis, DVT (deep venous thrombosis) (HCC), GERD (gastroesophageal reflux disease), Hiatal hernia, Hyperlipemia, IBS (irritable bowel syndrome), and Lichen sclerosus (37/8588). Also,  has a past surgical history that includes Ankle surgery (Left, 2010); Shoulder surgery (Right, 2012); Hand surgery (Bilateral, 2014); Fine needle aspiration (Left, 10-01-14); Breast cyst aspiration (Left, 10/02/2014); Breast biopsy (Bilateral, 2002); Breast biopsy (Left, 2022); and Esophagogastroduodenoscopy (egd) with propofol (N/A, 09/19/2020)., family history includes Diabetes in her father and sister; Hypertension in her father; Pancreatic cancer in her mother.,  reports that she has never smoked. She has never used smokeless tobacco. She reports that she does not drink alcohol and does not use drugs.  She has a current medication list which includes the following prescription(s): alprazolam, clobetasol cream, esomeprazole, fluticasone, loratadine, mirtazapine, montelukast, and pantoprazole. Also, is allergic to azithromycin, beclomethasone, doxycycline, erythromycin base, hydrocodone-acetaminophen, lansoprazole, levofloxacin in d5w, penicillin v potassium, prednisone, sulfa antibiotics, amoxicillin, cefuroxime axetil, and codeine.  Review of Systems  All other systems reviewed and are negative.  Objective: BP (!) 154/70   Ht 5\' 2"  (1.575 m)   Wt 126 lb (57.2 kg)   BMI 23.05  kg/m  Physical Exam Constitutional:      General: She is not in acute distress.    Appearance: She is well-developed.  Musculoskeletal:        General: Normal range of motion.  Neurological:     Mental Status: She is alert and oriented to person, place, and time.  Skin:    General: Skin is warm and dry.  Vitals reviewed.    A/P:   ICD-10-CM   1. Cystocele with incomplete uterovaginal prolapse  N81.2     Desires surgery and no more pessary use.  Pros and cons of surgery discussed. Plan TVH, BSO, Anterior repair. Surgery and recovery discussed. Husband present for discussion   A total of 20 minutes were spent face-to-face with the patient as well as preparation, review, communication, and documentation during this encounter.   Barnett Applebaum, MD, [facility], [facility] 01/07/2021  2:38 PM p

## 2021-01-13 ENCOUNTER — Other Ambulatory Visit: Payer: Self-pay | Admitting: Obstetrics & Gynecology

## 2021-01-13 ENCOUNTER — Telehealth: Payer: Self-pay

## 2021-01-13 DIAGNOSIS — N812 Incomplete uterovaginal prolapse: Secondary | ICD-10-CM

## 2021-01-13 NOTE — Telephone Encounter (Signed)
Discussed case scheduling with patient's husband, Konrad Dolores - ok'd on [place]  Called patient to schedule TVH/BSO, Anterior repair w Kenton Kingfisher  DOS 02/17/21  H&P 12/23 @ 10:20   Pre-admit phone call appointment to be requested - date and time will be included on H&P paper work. Also all appointments will be updated on pt MyChart. Explained that this appointment has a call window. Based on the time scheduled will indicate if the call will be received within a 4 hour window before 1:00 or after.  Advised that pt may also receive calls from the hospital pharmacy and pre-service center.  Confirmed pt has [employer] as primary insurance. No secondary insurance.

## 2021-01-13 NOTE — Telephone Encounter (Signed)
-----   Message from Gae Dry, MD sent at 01/07/2021  2:36 PM EST ----- Regarding: Surgery Surgery Booking Request Patient Full Name:  Janet Phillips  MRN: [id number]  DOB: Sep 21, 1950  Surgeon: Hoyt Koch, MD  Requested Surgery Date and Time: Jan Primary Diagnosis AND Code:    1. Cystocele with incomplete uterovaginal prolapse  N81.2 Secondary Diagnosis and Code:  Surgical Procedure: TVH/BSO, Anterior Repair RNFA Requested?: No L&D Notification: No Admission Status: same day surgery Length of Surgery: 75 min Special Case Needs: No H&P: Yes Phone Interview???:  Yes Interpreter: No Medical Clearance:  Yes _ DR Ramonita Lab  Special Scheduling Instructions: No Any known health/anesthesia issues, diabetes, sleep apnea, latex allergy, defibrillator/pacemaker?: No Acuity: P3   (P1 highest, P2 delay may cause harm, P3 low, elective gyn, P4 lowest)

## 2021-01-13 NOTE — Telephone Encounter (Signed)
Pessary rcvd/charged 07/03/2019

## 2021-01-15 NOTE — Telephone Encounter (Signed)
Medical clearance was rec'd from Dr Caryl Comes via fax on 01/15/21 and has been sent to be scanned into pt chart.

## 2021-02-03 ENCOUNTER — Other Ambulatory Visit: Payer: Self-pay

## 2021-02-03 ENCOUNTER — Other Ambulatory Visit
Admission: RE | Admit: 2021-02-03 | Discharge: 2021-02-03 | Disposition: A | Discharge status: Home / Self Care | Payer: Medicare HMO | Source: Ambulatory Visit | Attending: Obstetrics & Gynecology | Admitting: Obstetrics & Gynecology

## 2021-02-03 NOTE — Patient Instructions (Signed)
Your procedure is scheduled on: Tuesday February 18, 2020. Report to Day Surgery inside [facility] 2nd floor. To find out your arrival time please call [phone] between 1PM - 3PM on Friday February 13, 2021.  Remember: Instructions that are not followed completely may result in serious medical risk,  up to and including death, or upon the discretion of your surgeon and anesthesiologist your  surgery may need to be rescheduled.     _X__ 1. Do not eat food or drink fluids after midnight the night before your procedure.                 No chewing gum or hard candies.   __X__2.  On the morning of surgery brush your teeth with toothpaste and water, you                may rinse your mouth with mouthwash if you wish.  Do not swallow any toothpaste or mouthwash.     _X__ 3.  No Alcohol for 24 hours before or after surgery.   _X__ 4.  Do Not Smoke or use e-cigarettes For 24 Hours Prior to Your Surgery.                 Do not use any chewable tobacco products for at least 6 hours prior to                 Surgery.  _X__  5.  Do not use any recreational drugs (marijuana, cocaine, heroin, ecstasy, MDMA or other)                For at least one week prior to your surgery.  Combination of these drugs with anesthesia                May have life threatening results.   __X__ 6.  Notify your doctor if there is any change in your medical condition      (cold, fever, infections).     Do not wear jewelry, make-up, hairpins, clips or nail polish. Do not wear lotions, powders, or perfumes. You may wear deodorant. Do not shave 48 hours prior to surgery. Men may shave face and neck. Do not bring valuables to the hospital.    [facility] is not responsible for any belongings or valuables.  Contacts, dentures or bridgework may not be worn into surgery. Leave your suitcase in the car. After surgery it may be brought to your room. For patients admitted to the hospital, discharge  time is determined by your treatment team.   Patients discharged the day of surgery will not be allowed to drive home.   Make arrangements for someone to be with you for the first 24 hours of your Same Day Discharge.   __X__ Take these medicines the morning of surgery with A SIP OF WATER:    1. ALPRAZolam (XANAX) 0.5 MG  2. omeprazole (PRILOSEC) 40 MG  3.   4.  5.  6.  ____ Fleet Enema (as directed)   ____ Use CHG Soap (or wipes) as directed  ____ Use Benzoyl Peroxide Gel as instructed  ____ Use inhalers on the day of surgery  ____ Stop metformin 2 days prior to surgery    ____ Take 1/2 of usual insulin dose the night before surgery. No insulin the morning          of surgery.   ____ Call your PCP, cardiologist, or Pulmonologist if taking Coumadin/Plavix/aspirin and ask when to  stop before your surgery.   __X__ One Week prior to surgery- Stop Anti-inflammatories such as Ibuprofen, Aleve, Advil, Motrin, meloxicam (MOBIC), diclofenac, etodolac, ketorolac, Toradol, Daypro, piroxicam, Goody's or BC powders. OK TO USE TYLENOL IF NEEDED   __X__ Stop supplements until after surgery.    ____ Bring C-Pap to the hospital.    If you have any questions regarding your pre-procedure instructions,  Please call Pre-admit Testing at [phone]

## 2021-02-06 ENCOUNTER — Ambulatory Visit (INDEPENDENT_AMBULATORY_CARE_PROVIDER_SITE_OTHER): Payer: Medicare HMO | Admitting: Obstetrics & Gynecology

## 2021-02-06 ENCOUNTER — Encounter: Payer: Self-pay | Admitting: Obstetrics & Gynecology

## 2021-02-06 ENCOUNTER — Other Ambulatory Visit
Admission: RE | Admit: 2021-02-06 | Discharge: 2021-02-06 | Disposition: A | Discharge status: Home / Self Care | Payer: Medicare HMO | Source: Ambulatory Visit | Attending: Obstetrics & Gynecology | Admitting: Obstetrics & Gynecology

## 2021-02-06 ENCOUNTER — Other Ambulatory Visit: Payer: Self-pay

## 2021-02-06 VITALS — BP 126/84 | Ht 62.0 in | Wt 124.0 lb

## 2021-02-06 DIAGNOSIS — Z01818 Encounter for other preprocedural examination: Secondary | ICD-10-CM | POA: Insufficient documentation

## 2021-02-06 DIAGNOSIS — N812 Incomplete uterovaginal prolapse: Secondary | ICD-10-CM

## 2021-02-06 DIAGNOSIS — I10 Essential (primary) hypertension: Secondary | ICD-10-CM | POA: Insufficient documentation

## 2021-02-06 DIAGNOSIS — Z01810 Encounter for preprocedural cardiovascular examination: Secondary | ICD-10-CM | POA: Diagnosis not present

## 2021-02-06 LAB — TYPE AND SCREEN
ABO/RH(D): O NEG
Antibody Screen: NEGATIVE

## 2021-02-06 LAB — APTT: aPTT: 30 seconds (ref 24–36)

## 2021-02-06 LAB — CBC
HCT: 41.7 % (ref 36.0–46.0)
Hemoglobin: 13.4 g/dL (ref 12.0–15.0)
MCH: 26.9 pg (ref 26.0–34.0)
MCHC: 32.1 g/dL (ref 30.0–36.0)
MCV: 83.7 fL (ref 80.0–100.0)
Platelets: 309 10*3/uL (ref 150–400)
RBC: 4.98 MIL/uL (ref 3.87–5.11)
RDW: 13 % (ref 11.5–15.5)
WBC: 6.9 10*3/uL (ref 4.0–10.5)
nRBC: 0 % (ref 0.0–0.2)

## 2021-02-06 LAB — PROTIME-INR
INR: 1 (ref 0.8–1.2)
Prothrombin Time: 12.9 seconds (ref 11.4–15.2)

## 2021-02-06 NOTE — H&P (View-Only) (Signed)
PRE-OPERATIVE HISTORY AND PHYSICAL EXAM  HPI:  Janet Phillips is a 70 y.o. [id number] No LMP recorded. Patient is postmenopausal.; she is being admitted for surgery related to pelvic relaxation.  She has pelvic and vaginal pressure.  At times the uterus completely prolapses and she has to push back in.  Minimal GSI.  She has constipation and having BMs is difficult at times ad the pessary always extrudes or bothers her w BMs.    PMHx: Past Medical History:  Diagnosis Date   Anemia    Anxiety    Arthritis    DVT (deep venous thrombosis) (HCC)    GERD (gastroesophageal reflux disease)    Hiatal hernia    Hyperlipemia    IBS (irritable bowel syndrome)    Lichen sclerosus 65/0354   on bx   Past Surgical History:  Procedure Laterality Date   ANKLE SURGERY Left 2010   torn ligament   BREAST BIOPSY Bilateral 2002   neg/ Dr Orlene Och), right done here- neg    BREAST BIOPSY Left 2022   Byrnett/neg    BREAST CYST ASPIRATION Left 10/02/2014   cyst asp byrnett 6:00 4cmfn   ESOPHAGOGASTRODUODENOSCOPY (EGD) WITH PROPOFOL N/A 09/19/2020   Procedure: ESOPHAGOGASTRODUODENOSCOPY (EGD) WITH PROPOFOL;  Surgeon: Lesly Rubenstein, MD;  Location: [facility] ENDOSCOPY;  Service: Endoscopy;  Laterality: N/A;   FINE NEEDLE ASPIRATION Left 10-01-14   benign   HAND SURGERY Bilateral 2014   SHOULDER SURGERY Right 2012   torn ligament   Family History  Problem Relation Age of Onset   Pancreatic cancer Mother    Diabetes Father    Hypertension Father    Diabetes Sister    Social History   Tobacco Use   Smoking status: Never   Smokeless tobacco: Never  Vaping Use   Vaping Use: Never used  Substance Use Topics   Alcohol use: No    Alcohol/week: 0.0 standard drinks   Drug use: No    Current Outpatient Medications:    acetaminophen (TYLENOL) 500 MG tablet, Take 500 mg by mouth daily as needed (pain.)., Disp: , Rfl:    ALPRAZolam (XANAX) 0.5 MG tablet, Take 0.5 mg by mouth in the morning and at  bedtime., Disp: , Rfl:    fluticasone (FLONASE) 50 MCG/ACT nasal spray, Place into both nostrils as needed for allergies or rhinitis., Disp: , Rfl:    omeprazole (PRILOSEC) 40 MG capsule, Take 40 mg by mouth in the morning., Disp: , Rfl:    verapamil (CALAN-SR) 120 MG CR tablet, Take 120 mg by mouth daily after supper., Disp: , Rfl:  Allergies: Azithromycin, Beclomethasone, Doxycycline, Erythromycin base, Hydrocodone-acetaminophen, Lansoprazole, Levofloxacin in d5w, Penicillin v potassium, Prednisone, Sulfa antibiotics, Amoxicillin, Cefuroxime axetil, and Codeine  Review of Systems  Constitutional:  Negative for chills, fever and malaise/fatigue.  HENT:  Negative for congestion, sinus pain and sore throat.   Eyes:  Negative for blurred vision and pain.  Respiratory:  Negative for cough and wheezing.   Cardiovascular:  Negative for chest pain and leg swelling.  Gastrointestinal:  Negative for abdominal pain, constipation, diarrhea, heartburn, nausea and vomiting.  Genitourinary:  Negative for dysuria, frequency, hematuria and urgency.  Musculoskeletal:  Negative for back pain, joint pain, myalgias and neck pain.  Skin:  Negative for itching and rash.  Neurological:  Negative for dizziness, tremors and weakness.  Endo/Heme/Allergies:  Does not bruise/bleed easily.  Psychiatric/Behavioral:  Negative for depression. The patient is not nervous/anxious and does not have insomnia.  Objective: BP 126/84    Ht 5\' 2"  (1.575 m)    Wt 124 lb (56.2 kg)    BMI 22.68 kg/m   Filed Weights   02/06/21 1010  Weight: 124 lb (56.2 kg)   Physical Exam Constitutional:      General: She is not in acute distress.    Appearance: She is well-developed.  HENT:     Head: Normocephalic and atraumatic. No laceration.     Right Ear: Hearing normal.     Left Ear: Hearing normal.     Mouth/Throat:     Pharynx: Uvula midline.  Eyes:     Pupils: Pupils are equal, round, and reactive to light.  Neck:      Thyroid: No thyromegaly.  Cardiovascular:     Rate and Rhythm: Normal rate and regular rhythm.     Heart sounds: No murmur heard.   No friction rub. No gallop.  Pulmonary:     Effort: Pulmonary effort is normal. No respiratory distress.     Breath sounds: Normal breath sounds. No wheezing.  Abdominal:     General: Bowel sounds are normal. There is no distension.     Palpations: Abdomen is soft.     Tenderness: There is no abdominal tenderness. There is no rebound.  Musculoskeletal:        General: Normal range of motion.     Cervical back: Normal range of motion and neck supple.  Neurological:     Mental Status: She is alert and oriented to person, place, and time.     Cranial Nerves: No cranial nerve deficit.  Skin:    General: Skin is warm and dry.  Psychiatric:        Judgment: Judgment normal.  Vitals reviewed.    Assessment: 1. Cystocele with incomplete uterovaginal prolapse   Plan TVH, BSO, Ant Repair Surgery and recovery discussed  I have had a careful discussion with this patient about all the options available and the risk/benefits of each. I have fully informed this patient that surgery may subject her to a variety of discomforts and risks: She understands that most patients have surgery with little difficulty, but problems can happen ranging from minor to fatal. These include nausea, vomiting, pain, bleeding, infection, poor healing, hernia, or formation of adhesions. Unexpected reactions may occur from any drug or anesthetic given. Unintended injury may occur to other pelvic or abdominal structures such as Fallopian tubes, ovaries, bladder, ureter (tube from kidney to bladder), or bowel. Nerves going from the pelvis to the legs may be injured. Any such injury may require immediate or later additional surgery to correct the problem. Excessive blood loss requiring transfusion is very unlikely but possible. Dangerous blood clots may form in the legs or lungs. Physical and  sexual activity will be restricted in varying degrees for an indeterminate period of time but most often 2-6 weeks.  Finally, she understands that it is impossible to list every possible undesirable effect and that the condition for which surgery is done is not always cured or significantly improved, and in rare cases may be even worse.Ample time was given to answer all questions.  Barnett Applebaum, MD, [facility], [facility] 02/06/2021  11:07 AM

## 2021-02-06 NOTE — Patient Instructions (Signed)
Vaginal Hysterectomy, Care After The following information offers guidance on how to care for yourself after your procedure. Your health care provider may also give you more specific instructions. If you have problems or questions, contact your health care provider. What can I expect after the procedure? After the procedure, it is common to have: Pain in the lower abdomen and vagina. Vaginal bleeding and discharge for up to 1 week. You will need to use a sanitary pad after this procedure. Difficulty having a bowel movement (constipation). Temporary problems emptying the bladder. Tiredness (fatigue). Poor appetite. Less interest in sex. Feelings of sadness or other emotions. If your ovaries were also removed, it is also common to have symptoms of menopause, such as hot flashes, night sweats, and lack of sleep (insomnia). Follow these instructions at home: Medicines  Take over-the-counter and prescription medicines only as told by your health care provider. Do not take aspirin or NSAIDs, such as ibuprofen. These medicines can cause bleeding. Ask your health care provider if the medicine prescribed to you: Requires you to avoid driving or using machinery. Can cause constipation. You may need to take these actions to prevent or treat constipation: Drink enough fluid to keep your urine pale yellow. Take over-the-counter or prescription medicines. Eat foods that are high in fiber, such as beans, whole grains, and fresh fruits and vegetables. Limit foods that are high in fat and processed sugars, such as fried or sweet foods. Activity  Rest as told by your health care provider. Return to your normal activities as told by your health care provider. Ask your health care provider what activities are safe for you Avoid sitting for a long time without moving. Get up to take short walks every 1-2 hours. This is important to improve blood flow and breathing. Ask for help if you feel weak or  unsteady. Try to have someone home with you for 1-2 weeks to help you with everyday chores. Do not lift anything that is heavier than 10 lb (4.5 kg), or the limit that you are told, until your health care provider says that it is safe. If you were given a sedative during the procedure, it can affect you for several hours. Do not drive or operate machinery until your health care provider says that it is safe. Lifestyle Do not use any products that contain nicotine or tobacco. These products include cigarettes, chewing tobacco, and vaping devices, such as e-cigarettes. These can delay healing after surgery. If you need help quitting, ask your health care provider. Do not drink alcohol until your health care provider approves. General instructions Do not douche, use tampons, or have sex for at least 6 weeks, or as told by your health care provider. If you struggle with physical or emotional changes after your procedure, speak with your health care provider or a therapist. The stitches inside your vagina will dissolve over time and do not need to be taken out. Do not take baths, swim, or use a hot tub until your health care provider approves. You may only be allowed to take showers for 2-3 weeks Wear compression stockings as told by your health care provider. These stockings help to prevent blood clots and reduce swelling in your legs. Keep all follow-up visits. This is important. Contact a health care provider if: Your pain medicine is not helping. You have a fever. You have nausea or vomiting that does not go away. You feel dizzy. You have blood, pus, or a bad-smelling discharge from your vagina  more than 1 week after the procedure. You continue to have trouble urinating 3-5 days after the procedure. Get help right away if: You have severe pain in your abdomen or back. You faint. You have heavy vaginal bleeding and blood clots, soaking through a sanitary pad in less than 1 hour. You have chest  pain or shortness of breath. You have pain, swelling, or redness in your leg. These symptoms may represent a serious problem that is an emergency. Do not wait to see if the symptoms will go away. Get medical help right away. Call your local emergency services (911 in the [place] Do not drive yourself to the hospital. Summary After the procedure, it is common to have pain, vaginal bleeding, constipation, temporary problems emptying your bladder, and feelings of sadness or other emotions. Take over-the-counter and prescription medicines only as told by your health care provider. Rest as told by your health care provider. Return to your normal activities as told by your health care provider. Contact a health care provider if your pain medicine is not helping, or you have a fever, dizziness, or trouble urinating several days after the procedure. Get help right away if you have severe pain in your abdomen or back, or if you faint, have heavy bleeding, or have chest pain or shortness of breath. This information is not intended to replace advice given to you by your health care provider. Make sure you discuss any questions you have with your health care provider. Document Revised: 10/05/2019 Document Reviewed: 10/05/2019 Elsevier Patient Education  [place].

## 2021-02-06 NOTE — Progress Notes (Signed)
PRE-OPERATIVE HISTORY AND PHYSICAL EXAM  HPI:  Janet Phillips is a 70 y.o. [id number] No LMP recorded. Patient is postmenopausal.; she is being admitted for surgery related to pelvic relaxation.  She has pelvic and vaginal pressure.  At times the uterus completely prolapses and she has to push back in.  Minimal GSI.  She has constipation and having BMs is difficult at times ad the pessary always extrudes or bothers her w BMs.    PMHx: Past Medical History:  Diagnosis Date   Anemia    Anxiety    Arthritis    DVT (deep venous thrombosis) (HCC)    GERD (gastroesophageal reflux disease)    Hiatal hernia    Hyperlipemia    IBS (irritable bowel syndrome)    Lichen sclerosus 58/5277   on bx   Past Surgical History:  Procedure Laterality Date   ANKLE SURGERY Left 2010   torn ligament   BREAST BIOPSY Bilateral 2002   neg/ Dr Orlene Och), right done here- neg    BREAST BIOPSY Left 2022   Byrnett/neg    BREAST CYST ASPIRATION Left 10/02/2014   cyst asp byrnett 6:00 4cmfn   ESOPHAGOGASTRODUODENOSCOPY (EGD) WITH PROPOFOL N/A 09/19/2020   Procedure: ESOPHAGOGASTRODUODENOSCOPY (EGD) WITH PROPOFOL;  Surgeon: Lesly Rubenstein, MD;  Location: [facility] ENDOSCOPY;  Service: Endoscopy;  Laterality: N/A;   FINE NEEDLE ASPIRATION Left 10-01-14   benign   HAND SURGERY Bilateral 2014   SHOULDER SURGERY Right 2012   torn ligament   Family History  Problem Relation Age of Onset   Pancreatic cancer Mother    Diabetes Father    Hypertension Father    Diabetes Sister    Social History   Tobacco Use   Smoking status: Never   Smokeless tobacco: Never  Vaping Use   Vaping Use: Never used  Substance Use Topics   Alcohol use: No    Alcohol/week: 0.0 standard drinks   Drug use: No    Current Outpatient Medications:    acetaminophen (TYLENOL) 500 MG tablet, Take 500 mg by mouth daily as needed (pain.)., Disp: , Rfl:    ALPRAZolam (XANAX) 0.5 MG tablet, Take 0.5 mg by mouth in the morning and at  bedtime., Disp: , Rfl:    fluticasone (FLONASE) 50 MCG/ACT nasal spray, Place into both nostrils as needed for allergies or rhinitis., Disp: , Rfl:    omeprazole (PRILOSEC) 40 MG capsule, Take 40 mg by mouth in the morning., Disp: , Rfl:    verapamil (CALAN-SR) 120 MG CR tablet, Take 120 mg by mouth daily after supper., Disp: , Rfl:  Allergies: Azithromycin, Beclomethasone, Doxycycline, Erythromycin base, Hydrocodone-acetaminophen, Lansoprazole, Levofloxacin in d5w, Penicillin v potassium, Prednisone, Sulfa antibiotics, Amoxicillin, Cefuroxime axetil, and Codeine  Review of Systems  Constitutional:  Negative for chills, fever and malaise/fatigue.  HENT:  Negative for congestion, sinus pain and sore throat.   Eyes:  Negative for blurred vision and pain.  Respiratory:  Negative for cough and wheezing.   Cardiovascular:  Negative for chest pain and leg swelling.  Gastrointestinal:  Negative for abdominal pain, constipation, diarrhea, heartburn, nausea and vomiting.  Genitourinary:  Negative for dysuria, frequency, hematuria and urgency.  Musculoskeletal:  Negative for back pain, joint pain, myalgias and neck pain.  Skin:  Negative for itching and rash.  Neurological:  Negative for dizziness, tremors and weakness.  Endo/Heme/Allergies:  Does not bruise/bleed easily.  Psychiatric/Behavioral:  Negative for depression. The patient is not nervous/anxious and does not have insomnia.  Objective: BP 126/84    Ht 5\' 2"  (1.575 m)    Wt 124 lb (56.2 kg)    BMI 22.68 kg/m   Filed Weights   02/06/21 1010  Weight: 124 lb (56.2 kg)   Physical Exam Constitutional:      General: She is not in acute distress.    Appearance: She is well-developed.  HENT:     Head: Normocephalic and atraumatic. No laceration.     Right Ear: Hearing normal.     Left Ear: Hearing normal.     Mouth/Throat:     Pharynx: Uvula midline.  Eyes:     Pupils: Pupils are equal, round, and reactive to light.  Neck:      Thyroid: No thyromegaly.  Cardiovascular:     Rate and Rhythm: Normal rate and regular rhythm.     Heart sounds: No murmur heard.   No friction rub. No gallop.  Pulmonary:     Effort: Pulmonary effort is normal. No respiratory distress.     Breath sounds: Normal breath sounds. No wheezing.  Abdominal:     General: Bowel sounds are normal. There is no distension.     Palpations: Abdomen is soft.     Tenderness: There is no abdominal tenderness. There is no rebound.  Musculoskeletal:        General: Normal range of motion.     Cervical back: Normal range of motion and neck supple.  Neurological:     Mental Status: She is alert and oriented to person, place, and time.     Cranial Nerves: No cranial nerve deficit.  Skin:    General: Skin is warm and dry.  Psychiatric:        Judgment: Judgment normal.  Vitals reviewed.    Assessment: 1. Cystocele with incomplete uterovaginal prolapse   Plan TVH, BSO, Ant Repair Surgery and recovery discussed  I have had a careful discussion with this patient about all the options available and the risk/benefits of each. I have fully informed this patient that surgery may subject her to a variety of discomforts and risks: She understands that most patients have surgery with little difficulty, but problems can happen ranging from minor to fatal. These include nausea, vomiting, pain, bleeding, infection, poor healing, hernia, or formation of adhesions. Unexpected reactions may occur from any drug or anesthetic given. Unintended injury may occur to other pelvic or abdominal structures such as Fallopian tubes, ovaries, bladder, ureter (tube from kidney to bladder), or bowel. Nerves going from the pelvis to the legs may be injured. Any such injury may require immediate or later additional surgery to correct the problem. Excessive blood loss requiring transfusion is very unlikely but possible. Dangerous blood clots may form in the legs or lungs. Physical and  sexual activity will be restricted in varying degrees for an indeterminate period of time but most often 2-6 weeks.  Finally, she understands that it is impossible to list every possible undesirable effect and that the condition for which surgery is done is not always cured or significantly improved, and in rare cases may be even worse.Ample time was given to answer all questions.  Barnett Applebaum, MD, [facility], [facility] 02/06/2021  11:07 AM

## 2021-02-12 NOTE — Progress Notes (Signed)
Pt is having surgery and has risks for bleeding based on age.  These labs help to screen for bleeding disorders that could complicate surgery and recovery if not addressed beforehand.  Barnett Applebaum, MD, [facility], [facility] 02/12/2021  11:43 AM

## 2021-02-16 MED ORDER — METRONIDAZOLE 500 MG/100ML IV SOLN
500.0000 mg | INTRAVENOUS | Status: AC
  Administered 2021-02-17: 500 mg via INTRAVENOUS
  Filled 2021-02-16: qty 100

## 2021-02-16 MED ORDER — GENTAMICIN SULFATE 40 MG/ML IJ SOLN
5.0000 mg/kg | INTRAVENOUS | Status: AC
  Administered 2021-02-17: 286 mg via INTRAVENOUS
  Filled 2021-02-16: qty 7.25

## 2021-02-17 ENCOUNTER — Ambulatory Visit: Payer: Medicare HMO | Admitting: Anesthesiology

## 2021-02-17 ENCOUNTER — Ambulatory Visit
Admission: RE | Admit: 2021-02-17 | Discharge: 2021-02-17 | Disposition: A | Discharge status: Home / Self Care | Payer: Medicare HMO | Attending: Obstetrics & Gynecology | Admitting: Obstetrics & Gynecology

## 2021-02-17 ENCOUNTER — Other Ambulatory Visit: Payer: Self-pay

## 2021-02-17 ENCOUNTER — Encounter: Payer: Self-pay | Admitting: Obstetrics & Gynecology

## 2021-02-17 ENCOUNTER — Encounter
Admission: RE | Disposition: A | Discharge status: Home / Self Care | Payer: Self-pay | Source: Home / Self Care | Attending: Obstetrics & Gynecology

## 2021-02-17 ENCOUNTER — Ambulatory Visit: Payer: Medicare HMO | Admitting: Urgent Care

## 2021-02-17 DIAGNOSIS — K581 Irritable bowel syndrome with constipation: Secondary | ICD-10-CM | POA: Insufficient documentation

## 2021-02-17 DIAGNOSIS — N814 Uterovaginal prolapse, unspecified: Secondary | ICD-10-CM

## 2021-02-17 DIAGNOSIS — Z86718 Personal history of other venous thrombosis and embolism: Secondary | ICD-10-CM | POA: Diagnosis not present

## 2021-02-17 DIAGNOSIS — F419 Anxiety disorder, unspecified: Secondary | ICD-10-CM | POA: Insufficient documentation

## 2021-02-17 DIAGNOSIS — D259 Leiomyoma of uterus, unspecified: Secondary | ICD-10-CM | POA: Insufficient documentation

## 2021-02-17 DIAGNOSIS — N812 Incomplete uterovaginal prolapse: Secondary | ICD-10-CM | POA: Diagnosis not present

## 2021-02-17 DIAGNOSIS — K219 Gastro-esophageal reflux disease without esophagitis: Secondary | ICD-10-CM | POA: Insufficient documentation

## 2021-02-17 DIAGNOSIS — N858 Other specified noninflammatory disorders of uterus: Secondary | ICD-10-CM | POA: Insufficient documentation

## 2021-02-17 HISTORY — PX: VAGINAL HYSTERECTOMY: SHX2639

## 2021-02-17 HISTORY — PX: CYSTOCELE REPAIR: SHX163

## 2021-02-17 LAB — ABO/RH: ABO/RH(D): O NEG

## 2021-02-17 SURGERY — HYSTERECTOMY, VAGINAL
Anesthesia: General | Site: Vagina

## 2021-02-17 MED ORDER — ACETAMINOPHEN 10 MG/ML IV SOLN: 1000.0000 mg | Freq: Once | INTRAVENOUS | Status: DC | PRN

## 2021-02-17 MED ORDER — CHLORHEXIDINE GLUCONATE 0.12 % MT SOLN
OROMUCOSAL | Status: AC
  Administered 2021-02-17: 15 mL via OROMUCOSAL
  Filled 2021-02-17: qty 15

## 2021-02-17 MED ORDER — LIDOCAINE-EPINEPHRINE 1 %-1:100000 IJ SOLN
INTRAMUSCULAR | Status: DC | PRN
  Administered 2021-02-17: 10 mL
  Administered 2021-02-17: 5 mL

## 2021-02-17 MED ORDER — POVIDONE-IODINE 10 % EX SWAB
2.0000 "application " | Freq: Once | CUTANEOUS | Status: AC
  Administered 2021-02-17: 2 via TOPICAL

## 2021-02-17 MED ORDER — ROCURONIUM BROMIDE 100 MG/10ML IV SOLN
INTRAVENOUS | Status: DC | PRN
  Administered 2021-02-17: 50 mg via INTRAVENOUS

## 2021-02-17 MED ORDER — GLYCOPYRROLATE 0.2 MG/ML IJ SOLN
INTRAMUSCULAR | Status: AC
  Filled 2021-02-17: qty 1

## 2021-02-17 MED ORDER — ASPIRIN 81 MG PO CHEW
81.0000 mg | CHEWABLE_TABLET | ORAL | Status: AC
  Administered 2021-02-17: 81 mg via ORAL

## 2021-02-17 MED ORDER — LIDOCAINE HCL (PF) 2 % IJ SOLN
INTRAMUSCULAR | Status: AC
  Filled 2021-02-17: qty 5

## 2021-02-17 MED ORDER — ORAL CARE MOUTH RINSE: 15.0000 mL | Freq: Once | OROMUCOSAL | Status: AC

## 2021-02-17 MED ORDER — MORPHINE SULFATE (PF) 2 MG/ML IV SOLN: 1.0000 mg | INTRAVENOUS | Status: DC | PRN

## 2021-02-17 MED ORDER — FENTANYL CITRATE (PF) 100 MCG/2ML IJ SOLN
INTRAMUSCULAR | Status: DC | PRN
  Administered 2021-02-17: 100 ug via INTRAVENOUS

## 2021-02-17 MED ORDER — ONDANSETRON HCL 4 MG/2ML IJ SOLN
INTRAMUSCULAR | Status: AC
  Filled 2021-02-17: qty 2

## 2021-02-17 MED ORDER — APREPITANT 40 MG PO CAPS
ORAL_CAPSULE | ORAL | Status: AC
  Filled 2021-02-17: qty 1

## 2021-02-17 MED ORDER — LACTATED RINGERS IV BOLUS
1000.0000 mL | Freq: Once | INTRAVENOUS | Status: AC
  Administered 2021-02-17: 1000 mL via INTRAVENOUS

## 2021-02-17 MED ORDER — FENTANYL CITRATE (PF) 100 MCG/2ML IJ SOLN: 25.0000 ug | INTRAMUSCULAR | Status: DC | PRN

## 2021-02-17 MED ORDER — PROPOFOL 10 MG/ML IV BOLUS
INTRAVENOUS | Status: DC | PRN
Start: 2021-02-17 — End: 2021-02-17
  Administered 2021-02-17: 120 mg via INTRAVENOUS

## 2021-02-17 MED ORDER — GLYCOPYRROLATE 0.2 MG/ML IJ SOLN
INTRAMUSCULAR | Status: DC | PRN
  Administered 2021-02-17: .2 mg via INTRAVENOUS

## 2021-02-17 MED ORDER — ACETAMINOPHEN 10 MG/ML IV SOLN
INTRAVENOUS | Status: DC | PRN
  Administered 2021-02-17: 1000 mg via INTRAVENOUS

## 2021-02-17 MED ORDER — PROPOFOL 10 MG/ML IV BOLUS
INTRAVENOUS | Status: AC
  Filled 2021-02-17: qty 20

## 2021-02-17 MED ORDER — MIDAZOLAM HCL 2 MG/2ML IJ SOLN
INTRAMUSCULAR | Status: DC | PRN
Start: 2021-02-17 — End: 2021-02-17
  Administered 2021-02-17: 2 mg via INTRAVENOUS

## 2021-02-17 MED ORDER — CHLORHEXIDINE GLUCONATE 0.12 % MT SOLN: 15.0000 mL | Freq: Once | OROMUCOSAL | Status: AC

## 2021-02-17 MED ORDER — ACETAMINOPHEN 10 MG/ML IV SOLN
INTRAVENOUS | Status: AC
  Filled 2021-02-17: qty 100

## 2021-02-17 MED ORDER — PHENYLEPHRINE HCL-NACL 20-0.9 MG/250ML-% IV SOLN
INTRAVENOUS | Status: AC
  Filled 2021-02-17: qty 250

## 2021-02-17 MED ORDER — ONDANSETRON HCL 4 MG/2ML IJ SOLN
INTRAMUSCULAR | Status: DC | PRN
Start: 2021-02-17 — End: 2021-02-17
  Administered 2021-02-17: 4 mg via INTRAVENOUS

## 2021-02-17 MED ORDER — DEXAMETHASONE SODIUM PHOSPHATE 10 MG/ML IJ SOLN
INTRAMUSCULAR | Status: AC
  Filled 2021-02-17: qty 1

## 2021-02-17 MED ORDER — ONDANSETRON HCL 4 MG/2ML IJ SOLN: 4.0000 mg | Freq: Once | INTRAMUSCULAR | Status: DC | PRN

## 2021-02-17 MED ORDER — LIDOCAINE HCL (CARDIAC) PF 100 MG/5ML IV SOSY
PREFILLED_SYRINGE | INTRAVENOUS | Status: DC | PRN
  Administered 2021-02-17: 60 mg via INTRAVENOUS

## 2021-02-17 MED ORDER — ROCURONIUM BROMIDE 10 MG/ML (PF) SYRINGE
PREFILLED_SYRINGE | INTRAVENOUS | Status: AC
  Filled 2021-02-17: qty 10

## 2021-02-17 MED ORDER — ASPIRIN 81 MG PO CHEW
CHEWABLE_TABLET | ORAL | Status: AC
  Filled 2021-02-17: qty 1

## 2021-02-17 MED ORDER — SUGAMMADEX SODIUM 200 MG/2ML IV SOLN
INTRAVENOUS | Status: DC | PRN
  Administered 2021-02-17: 200 mg via INTRAVENOUS

## 2021-02-17 MED ORDER — ESTROGENS CONJUGATED 0.625 MG/GM VA CREA
TOPICAL_CREAM | VAGINAL | Status: DC | PRN
Start: 2021-02-17 — End: 2021-02-17
  Administered 2021-02-17: 1 via VAGINAL

## 2021-02-17 MED ORDER — DEXAMETHASONE SODIUM PHOSPHATE 10 MG/ML IJ SOLN
INTRAMUSCULAR | Status: DC | PRN
  Administered 2021-02-17: 5 mg via INTRAVENOUS

## 2021-02-17 MED ORDER — FENTANYL CITRATE (PF) 100 MCG/2ML IJ SOLN
INTRAMUSCULAR | Status: AC
  Filled 2021-02-17: qty 2

## 2021-02-17 MED ORDER — MIDAZOLAM HCL 2 MG/2ML IJ SOLN
INTRAMUSCULAR | Status: AC
  Filled 2021-02-17: qty 2

## 2021-02-17 MED ORDER — LACTATED RINGERS IV SOLN: INTRAVENOUS | Status: DC

## 2021-02-17 MED ORDER — APREPITANT 40 MG PO CAPS
40.0000 mg | ORAL_CAPSULE | Freq: Once | ORAL | Status: AC
Start: 2021-02-17 — End: 2021-02-17
  Administered 2021-02-17: 40 mg via ORAL

## 2021-02-17 MED ORDER — PHENYLEPHRINE 40 MCG/ML (10ML) SYRINGE FOR IV PUSH (FOR BLOOD PRESSURE SUPPORT)
PREFILLED_SYRINGE | INTRAVENOUS | Status: DC | PRN
  Administered 2021-02-17: 80 ug via INTRAVENOUS
  Administered 2021-02-17 (×3): 40 ug via INTRAVENOUS
  Administered 2021-02-17 (×2): 80 ug via INTRAVENOUS

## 2021-02-17 MED ORDER — OXYCODONE-ACETAMINOPHEN 5-325 MG PO TABS: 1.0000 | ORAL_TABLET | ORAL | 0 refills | Status: AC | PRN

## 2021-02-17 MED ORDER — OXYCODONE-ACETAMINOPHEN 5-325 MG PO TABS: 1.0000 | ORAL_TABLET | ORAL | Status: DC | PRN

## 2021-02-17 SURGICAL SUPPLY — 48 items
BAG DRN RND TRDRP ANRFLXCHMBR (UROLOGICAL SUPPLIES) ×2
BAG URINE DRAIN 2000ML AR STRL (UROLOGICAL SUPPLIES) ×4 IMPLANT
BLADE SURG SZ10 CARB STEEL (BLADE) ×4 IMPLANT
CATH FOLEY 2WAY  5CC 16FR (CATHETERS) ×3
CATH FOLEY 2WAY 5CC 16FR (CATHETERS) ×2
CATH URTH 16FR FL 2W BLN LF (CATHETERS) ×3 IMPLANT
DRAPE 3/4 80X56 (DRAPES) ×4 IMPLANT
DRAPE PERI LITHO V/GYN (MISCELLANEOUS) ×4 IMPLANT
DRAPE UNDER BUTTOCK W/FLU (DRAPES) ×4 IMPLANT
ELECT CAUTERY BLADE 6.4 (BLADE) ×4 IMPLANT
ELECT REM PT RETURN 9FT ADLT (ELECTROSURGICAL) ×3
ELECTRODE REM PT RTRN 9FT ADLT (ELECTROSURGICAL) ×3 IMPLANT
GAUZE 4X4 16PLY ~~LOC~~+RFID DBL (SPONGE) ×8 IMPLANT
GAUZE PACK 2X3YD (PACKING) IMPLANT
GLOVE SURG ENC MOIS LTX SZ8 (GLOVE) ×4 IMPLANT
GOWN STRL REUS W/ TWL LRG LVL3 (GOWN DISPOSABLE) ×3 IMPLANT
GOWN STRL REUS W/ TWL XL LVL3 (GOWN DISPOSABLE) ×3 IMPLANT
GOWN STRL REUS W/TWL LRG LVL3 (GOWN DISPOSABLE) ×3
GOWN STRL REUS W/TWL XL LVL3 (GOWN DISPOSABLE) ×3
HOLDER FOLEY CATH W/STRAP (MISCELLANEOUS) ×1 IMPLANT
LABEL OR SOLS (LABEL) ×4 IMPLANT
MANIFOLD NEPTUNE II (INSTRUMENTS) ×4 IMPLANT
NDL MAYO CATGUT SZ4 (NEEDLE) ×3 IMPLANT
NDL MAYO CATGUT SZ4 TCR NDL (NEEDLE) ×3 IMPLANT
NEEDLE HYPO 22GX1.5 SAFETY (NEEDLE) ×4 IMPLANT
NS IRRIG 500ML POUR BTL (IV SOLUTION) ×4 IMPLANT
PACK BASIN MINOR ARMC (MISCELLANEOUS) ×4 IMPLANT
PAD OB MATERNITY 4.3X12.25 (PERSONAL CARE ITEMS) ×4 IMPLANT
PAD PREP 24X41 OB/GYN DISP (PERSONAL CARE ITEMS) ×4 IMPLANT
RETRACTOR PHONTONGUIDE ADAPT (ADAPTER) IMPLANT
SCRUB EXIDINE 4% CHG 4OZ (MISCELLANEOUS) ×4 IMPLANT
SOL PREP PVP 2OZ (MISCELLANEOUS) ×3
SOLUTION PREP PVP 2OZ (MISCELLANEOUS) ×3 IMPLANT
SPONGE T-LAP 18X18 ~~LOC~~+RFID (SPONGE) ×1 IMPLANT
SPONGE T-LAP 4X18 ~~LOC~~+RFID (SPONGE) ×4 IMPLANT
STRAP SAFETY 5IN WIDE (MISCELLANEOUS) ×4 IMPLANT
SURGILUBE 2OZ TUBE FLIPTOP (MISCELLANEOUS) ×4 IMPLANT
SUT ETHIBOND NAB CT1 #1 30IN (SUTURE) ×4 IMPLANT
SUT VIC AB 0 CT1 27 (SUTURE) ×6
SUT VIC AB 0 CT1 27XCR 8 STRN (SUTURE) ×6 IMPLANT
SUT VIC AB 0 CT1 36 (SUTURE) ×4 IMPLANT
SUT VIC AB 1 CT1 36 (SUTURE) ×4 IMPLANT
SUT VIC AB 2-0 CT1 (SUTURE) ×4 IMPLANT
SYR 10ML LL (SYRINGE) ×8 IMPLANT
SYR BULB IRRIG 60ML STRL (SYRINGE) ×4 IMPLANT
SYR CONTROL 10ML LL (SYRINGE) ×4 IMPLANT
TAPE TRANSPORE STRL 2 31045 (GAUZE/BANDAGES/DRESSINGS) ×4 IMPLANT
WATER STERILE IRR 500ML POUR (IV SOLUTION) ×4 IMPLANT

## 2021-02-17 NOTE — Op Note (Signed)
Preoperative diagnosis: Uterine prolapse  Postoperative diagnosis: Same  Procedure: Transvaginal hysterectomy  Anterior colporrhaphy  Surgeon: Barnett Applebaum, M.D.  Assistant: Dr Gilman Schmidt No other capable assistant available, in surgery requiring high level assistant.  Anesthesia: general  Findings: Small uterus and atrophic ovaries.  Estimated blood loss: 20 cc  Specimen: Uterus to pathology   Disposition: Tolerated procedure well  Procedure: Patient was taken to the OR where she was placed in dorsal lithotomy in [place]. She was prepped and draped in the usual sterile fashion. A timeout was performed. Foley is placed into bladder. A speculum was placed inside the vagina. The cervix was visualized and grasped with a tenaculum. 1% lidocaine with epinephrine were injected paracervically. A bovie was used to make a circumferential incision around the vagina. An opened sponge was used to dissect the vagina off the cervix. The posterior peritoneum was entered sharply with Mayo scissors.  The anterior peritoneal cavity was entered sharply with careful dissection of the bladder off the underlying cervix. A Heaney clamp was used to clamp first the left uterosacral ligament and cardinal which was then cut and Haney suture ligated with 0 Vicryl stitch, the stitch was held and later attached to the vaginal mucosa. Similarly the right uterosacral ligament was clamped cut and suture ligated.  Sequential clamping, transecting and suture ligating up the broad to the uterine arteries were taken until the tubo-ovarian pedicles were encountered. The uterus was then amputated. The left and right ovaries were atrophic and lateral to pelvic side wall.  It was deemed unnecessary risk to remove them. The peritoneum was then closed with a running pursestring suture of 0 Vicryl. The uterosacrals were plicated using a 1-0 Ethibond suture.  Vagina is irrigated. Anterior colporrhaphy is performed.  Allis clamps are  placed along the anterior vaginal wall, lidocaine is used to infiltrate the plane, and incision is made midline vertical.  Endopelvic fascia is dissected free of vaginal mucosa.  Fascia is plicated w interrupted vicryl sutures.  Excess mucosa is excised.  Vaginal incision is closed with a running locking Vicryl suture, to incoprate the hysterectomy incision as well, with care taken to incorporate the uterosacral pedicles. Excellent hemostasis was noted at the end of the case. The vaginal cuff was inspected there was minimal bleeding noted.  The assistance of my assisting-physician was vital to resect and retract interchangably with self on each side. Packing gauze w Premarin cream is placed. A Foley catheter is left in  place inside her bladder. Clear, yellow urine was noted. All instrument needle and lap counts were correct x 2. Patient was awakened taken to recovery room in stable condition.  Hoyt Koch, MD 02/17/2021, 10:44 AM

## 2021-02-17 NOTE — Anesthesia Preprocedure Evaluation (Signed)
Anesthesia Evaluation  Patient identified by MRN, date of birth, ID band Patient awake    Reviewed: Allergy & Precautions, NPO status , Patient's Chart, lab work & pertinent test results  History of Anesthesia Complications Negative for: history of anesthetic complications  Airway Mallampati: II  TM Distance: >3 FB Neck ROM: Full    Dental no notable dental hx. (+) Teeth Intact   Pulmonary neg pulmonary ROS, neg sleep apnea, neg COPD, Patient abstained from smoking.Not current smoker,    Pulmonary exam normal breath sounds clear to auscultation       Cardiovascular Exercise Tolerance: Good METS(-) hypertension(-) CAD and (-) Past MI negative cardio ROS  (-) dysrhythmias  Rhythm:Regular Rate:Normal - Systolic murmurs    Neuro/Psych PSYCHIATRIC DISORDERS Anxiety negative neurological ROS     GI/Hepatic hiatal hernia, GERD  Medicated and Controlled,(+)     (-) substance abuse  ,   Endo/Other  neg diabetes  Renal/GU negative Renal ROS     Musculoskeletal  (+) Arthritis ,   Abdominal   Peds  Hematology   Anesthesia Other Findings Past Medical History: No date: Anemia No date: Anxiety No date: Arthritis No date: DVT (deep venous thrombosis) (HCC) No date: GERD (gastroesophageal reflux disease) No date: Hiatal hernia No date: Hyperlipemia No date: IBS (irritable bowel syndrome) 26/9485: Lichen sclerosus     Comment:  on bx  Reproductive/Obstetrics                            Anesthesia Physical Anesthesia Plan  ASA: 2  Anesthesia Plan: General   Post-op Pain Management: Ofirmev IV (intra-op) and Toradol IV (intra-op)   Induction: Intravenous  PONV Risk Score and Plan: 4 or greater and Ondansetron, Dexamethasone, Midazolam and Aprepitant  Airway Management Planned: Oral ETT  Additional Equipment: None  Intra-op Plan:   Post-operative Plan: Extubation in OR  Informed  Consent: I have reviewed the patients History and Physical, chart, labs and discussed the procedure including the risks, benefits and alternatives for the proposed anesthesia with the patient or authorized representative who has indicated his/her understanding and acceptance.     Dental advisory given  Plan Discussed with: CRNA and Surgeon  Anesthesia Plan Comments: (Discussed risks of anesthesia with patient, including PONV, sore throat, lip/dental/eye damage. Rare risks discussed as well, such as cardiorespiratory and neurological sequelae, and allergic reactions. Discussed the role of CRNA in patient's perioperative care. Patient understands.)        Anesthesia Quick Evaluation

## 2021-02-17 NOTE — Progress Notes (Signed)
Patient voided 35mL. Bladder scan shows 138mL.  Messaging Dr Kenton Kingfisher for further instructions

## 2021-02-17 NOTE — Progress Notes (Signed)
Per dr Kenton Kingfisher ok to go home. Instructed patient and husband for her to use the bathroom regularly. Voiced understanding

## 2021-02-17 NOTE — Progress Notes (Signed)
Patient has been in SB heart rhythm since arrival to pacu.  Pressure has increased with fluids.  EKG was done and anesthesia Cristobal Goldmann came to bedside to sign EKG and see pt X 2.  Pt remains disoriented to time and place.  Preop nurse reports flat affect preop. Husband reports she has some mild dementia and normal for her not to know the date.  Per Dr. Cristobal Goldmann ok to discharge pt.

## 2021-02-17 NOTE — Interval H&P Note (Signed)
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  The various methods of treatment have been discussed with the patient and family. After consideration of risks, benefits and other options for treatment, the patient has consented to  Procedure(s): HYSTERECTOMY VAGINAL WITH BILATERAL SALPINGO-OOPHORECTOMY (Bilateral) ANTERIOR REPAIR (CYSTOCELE) (N/A) as a surgical intervention.  The patient's history has been reviewed, patient examined, no change in status, stable for surgery.  I have reviewed the patient's chart and labs.  Questions were answered to the patient's satisfaction.     Hoyt Koch

## 2021-02-17 NOTE — Transfer of Care (Signed)
Immediate Anesthesia Transfer of Care Note  Patient: Janet Phillips  Procedure(s) Performed: VAGINAL HYSTERECTOMY (Bilateral: Vagina ) ANTERIOR REPAIR (CYSTOCELE) (Vagina )  Patient Location: PACU  Anesthesia Type:General  Level of Consciousness: sedated  Airway & Oxygen Therapy: Patient Spontanous Breathing and Patient connected to face mask oxygen  Post-op Assessment: Report given to RN and Post -op Vital signs reviewed and stable  Post vital signs: Reviewed and stable  Last Vitals:  Vitals Value Taken Time  BP 97/40 02/17/21 1052  Temp    Pulse 55 02/17/21 1054  Resp 11 02/17/21 1054  SpO2 100 % 02/17/21 1054  Vitals shown include unvalidated device data.  Last Pain:  Vitals:   02/17/21 0734  TempSrc: Temporal  PainSc: 0-No pain         Complications: No notable events documented.

## 2021-02-17 NOTE — Anesthesia Procedure Notes (Signed)
Procedure Name: Intubation Date/Time: 02/17/2021 9:31 AM Performed by: Lily Peer, Nusrat Encarnacion, CRNA Pre-anesthesia Checklist: Patient identified, Emergency Drugs available, Suction available and Patient being monitored Patient Re-evaluated:Patient Re-evaluated prior to induction Oxygen Delivery Method: Circle system utilized Preoxygenation: Pre-oxygenation with 100% oxygen Induction Type: IV induction Ventilation: Mask ventilation without difficulty Laryngoscope Size: McGraph and 3 Grade View: Grade I Tube type: Oral Tube size: 6.5 mm Number of attempts: 1 Airway Equipment and Method: Stylet Placement Confirmation: ETT inserted through vocal cords under direct vision, positive ETCO2 and breath sounds checked- equal and bilateral Secured at: 19 cm Tube secured with: Tape Dental Injury: Teeth and Oropharynx as per pre-operative assessment

## 2021-02-17 NOTE — Anesthesia Postprocedure Evaluation (Signed)
Anesthesia Post Note  Patient: Janet Phillips  Procedure(s) Performed: VAGINAL HYSTERECTOMY (Bilateral: Vagina ) ANTERIOR REPAIR (CYSTOCELE) (Vagina )  Anesthesia Type: General Anesthetic complications: no   There were no known notable events for this encounter.   Last Vitals:  Vitals:   02/17/21 1115 02/17/21 1130  BP: (!) 101/43 (!) 108/46  Pulse: 63 (!) 51  Resp: (!) 22 12  Temp: (!) 36.3 C   SpO2: 97% 98%    Last Pain:  Vitals:   02/17/21 1115  TempSrc:   PainSc: 0-No pain                 Cabery Blas

## 2021-02-18 ENCOUNTER — Encounter: Payer: Self-pay | Admitting: Obstetrics & Gynecology

## 2021-02-18 LAB — SURGICAL PATHOLOGY

## 2021-02-25 ENCOUNTER — Ambulatory Visit (INDEPENDENT_AMBULATORY_CARE_PROVIDER_SITE_OTHER): Payer: Medicare HMO | Admitting: Obstetrics & Gynecology

## 2021-02-25 ENCOUNTER — Other Ambulatory Visit: Payer: Self-pay

## 2021-02-25 ENCOUNTER — Encounter: Payer: Self-pay | Admitting: Obstetrics & Gynecology

## 2021-02-25 VITALS — BP 140/80 | Ht 62.0 in | Wt 120.0 lb

## 2021-02-25 DIAGNOSIS — Z90710 Acquired absence of both cervix and uterus: Secondary | ICD-10-CM

## 2021-02-25 NOTE — Progress Notes (Signed)
°  Postoperative Follow-up Patient presents post op from vaginal hysterectomy for pelvic relaxation, 1 week ago.  Subjective: Patient reports some improvement in her preop symptoms. Eating a regular diet without difficulty. The patient is not having any pain.  Activity: normal activities of daily living. Patient reports additional symptom's since surgery of None.  Objective: BP 140/80    Ht 5\' 2"  (1.575 m)    Wt 120 lb (54.4 kg)    BMI 21.95 kg/m  Physical Exam Constitutional:      General: She is not in acute distress.    Appearance: She is well-developed.  Musculoskeletal:        General: Normal range of motion.  Neurological:     Mental Status: She is alert and oriented to person, place, and time.  Skin:    General: Skin is warm and dry.  Vitals reviewed.    Assessment: s/p :  vaginal hysterectomy and anterior colporrhaphy progressing well  Plan: Patient has done well after surgery with no apparent complications.  I have discussed the post-operative course to date, and the expected progress moving forward.  The patient understands what complications to be concerned about.  I will see the patient in routine follow up, or sooner if needed.    Activity plan: No heavy lifting.  Pelvic rest.  Hoyt Koch 02/25/2021, 3:57 PM

## 2021-03-12 ENCOUNTER — Ambulatory Visit: Payer: Medicare HMO | Admitting: Obstetrics

## 2021-03-12 ENCOUNTER — Encounter: Payer: Self-pay | Admitting: Obstetrics

## 2021-03-12 ENCOUNTER — Other Ambulatory Visit: Payer: Self-pay

## 2021-03-12 VITALS — BP 136/84 | Ht 62.0 in | Wt 110.0 lb

## 2021-03-12 DIAGNOSIS — N3289 Other specified disorders of bladder: Secondary | ICD-10-CM | POA: Diagnosis not present

## 2021-03-12 DIAGNOSIS — R300 Dysuria: Secondary | ICD-10-CM

## 2021-03-12 DIAGNOSIS — R399 Unspecified symptoms and signs involving the genitourinary system: Secondary | ICD-10-CM

## 2021-03-12 LAB — POCT URINALYSIS DIPSTICK
Bilirubin, UA: NEGATIVE
Blood, UA: NEGATIVE
Glucose, UA: NEGATIVE
Ketones, UA: NEGATIVE
Nitrite, UA: NEGATIVE
Protein, UA: NEGATIVE
Spec Grav, UA: 1.015 (ref 1.010–1.025)
Urobilinogen, UA: NEGATIVE E.U./dL — AB
pH, UA: 6.5 (ref 5.0–8.0)

## 2021-03-12 MED ORDER — NITROFURANTOIN MONOHYD MACRO 100 MG PO CAPS: 100.0000 mg | ORAL_CAPSULE | Freq: Two times a day (BID) | ORAL | 0 refills | Status: DC

## 2021-03-12 MED ORDER — PHENAZOPYRIDINE HCL 200 MG PO TABS: 200.0000 mg | ORAL_TABLET | Freq: Three times a day (TID) | ORAL | 1 refills | Status: AC | PRN

## 2021-03-12 NOTE — Progress Notes (Signed)
[name] is a 72 y.o. [id number] who LMP was No LMP recorded. Patient is postmenopausal., presents today for a problem visit. She is accompanied by her husband. [name] had a hysterectomy, and is followed by Dr. Kenton Kingfisher. She complains of frequency, inability to void, and urgency . She has had symptoms for 5 days. Patient also complains of back pain. Symptoms are mild.  Patient denies fever. Patient does not have a history of recurrent UTI,  does not have a history of pyelonephritis, does not have a history of nephrolithiasis.  She has not had previous treatment for her current symptoms.  BP 136/84    Ht 5\' 2"  (1.575 m)    Wt 110 lb (49.9 kg)    BMI 20.12 kg/m   Results for orders placed or performed in visit on 03/12/21 (from the past 24 hour(s))  POCT Urinalysis Dipstick     Status: Abnormal   Collection Time: 03/12/21  1:40 PM  Result Value Ref Range   Color, UA dark yellow    Clarity, UA clear    Glucose, UA Negative Negative   Bilirubin, UA neg    Ketones, UA neg    Spec Grav, UA 1.015 1.010 - 1.025   Blood, UA neg    pH, UA 6.5 5.0 - 8.0   Protein, UA Negative Negative   Urobilinogen, UA negative (A) 0.2 or 1.0 E.U./dL   Nitrite, UA neg    Leukocytes, UA Moderate (2+) (A) Negative   Appearance     Odor     jA: Probable UTI S/P hystectomy (3 weeks ago)  Plan: will start her on Macrobid and Pyridium for the urgency. Urine sent for culture. She has another post op  f/u visit planned with Dr. Suzanne Boron can discuss the culture results then. Will adjust the RX based on C and S. Shanen Norris M Valissa Lyvers, CNM  03/12/2021 2:08 PM

## 2021-03-14 LAB — URINE CULTURE

## 2021-03-15 NOTE — Progress Notes (Signed)
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 She was seen for some dysuria and urine culture indicates GBS and less than 50,000 colonies of mixed flora. She was placed on Macrobid while the culture was pending. She has follow up with Dr. Kenton Kingfisher this week and will fu with him on any continuing concerns.

## 2021-03-16 ENCOUNTER — Other Ambulatory Visit: Payer: Self-pay

## 2021-03-16 ENCOUNTER — Inpatient Hospital Stay
Admission: EM | Admit: 2021-03-16 | Discharge: 2021-03-20 | DRG: 871 | Disposition: A | Discharge status: Home Health | Payer: Medicare HMO | Attending: Internal Medicine | Admitting: Internal Medicine

## 2021-03-16 ENCOUNTER — Emergency Department: Payer: Medicare HMO

## 2021-03-16 ENCOUNTER — Encounter: Payer: Self-pay | Admitting: Emergency Medicine

## 2021-03-16 DIAGNOSIS — Z6820 Body mass index (BMI) 20.0-20.9, adult: Secondary | ICD-10-CM

## 2021-03-16 DIAGNOSIS — Z90710 Acquired absence of both cervix and uterus: Secondary | ICD-10-CM

## 2021-03-16 DIAGNOSIS — F32A Depression, unspecified: Secondary | ICD-10-CM | POA: Diagnosis present

## 2021-03-16 DIAGNOSIS — N10 Acute tubulo-interstitial nephritis: Secondary | ICD-10-CM | POA: Diagnosis present

## 2021-03-16 DIAGNOSIS — I10 Essential (primary) hypertension: Secondary | ICD-10-CM | POA: Diagnosis present

## 2021-03-16 DIAGNOSIS — E870 Hyperosmolality and hypernatremia: Secondary | ICD-10-CM | POA: Diagnosis present

## 2021-03-16 DIAGNOSIS — Z87891 Personal history of nicotine dependence: Secondary | ICD-10-CM

## 2021-03-16 DIAGNOSIS — A401 Sepsis due to streptococcus, group B: Principal | ICD-10-CM | POA: Diagnosis present

## 2021-03-16 DIAGNOSIS — E440 Moderate protein-calorie malnutrition: Secondary | ICD-10-CM | POA: Diagnosis present

## 2021-03-16 DIAGNOSIS — F0394 Unspecified dementia, unspecified severity, with anxiety: Secondary | ICD-10-CM | POA: Diagnosis present

## 2021-03-16 DIAGNOSIS — E860 Dehydration: Secondary | ICD-10-CM | POA: Diagnosis present

## 2021-03-16 DIAGNOSIS — Z888 Allergy status to other drugs, medicaments and biological substances status: Secondary | ICD-10-CM

## 2021-03-16 DIAGNOSIS — Z882 Allergy status to sulfonamides status: Secondary | ICD-10-CM

## 2021-03-16 DIAGNOSIS — D72829 Elevated white blood cell count, unspecified: Secondary | ICD-10-CM | POA: Diagnosis present

## 2021-03-16 DIAGNOSIS — A419 Sepsis, unspecified organism: Principal | ICD-10-CM

## 2021-03-16 DIAGNOSIS — J9601 Acute respiratory failure with hypoxia: Secondary | ICD-10-CM | POA: Diagnosis present

## 2021-03-16 DIAGNOSIS — E876 Hypokalemia: Secondary | ICD-10-CM | POA: Diagnosis not present

## 2021-03-16 DIAGNOSIS — R6520 Severe sepsis without septic shock: Secondary | ICD-10-CM | POA: Diagnosis present

## 2021-03-16 DIAGNOSIS — Z20822 Contact with and (suspected) exposure to covid-19: Secondary | ICD-10-CM | POA: Diagnosis present

## 2021-03-16 DIAGNOSIS — E785 Hyperlipidemia, unspecified: Secondary | ICD-10-CM | POA: Diagnosis present

## 2021-03-16 DIAGNOSIS — K589 Irritable bowel syndrome without diarrhea: Secondary | ICD-10-CM | POA: Diagnosis present

## 2021-03-16 DIAGNOSIS — Z880 Allergy status to penicillin: Secondary | ICD-10-CM

## 2021-03-16 DIAGNOSIS — K5900 Constipation, unspecified: Secondary | ICD-10-CM | POA: Diagnosis present

## 2021-03-16 DIAGNOSIS — F419 Anxiety disorder, unspecified: Secondary | ICD-10-CM | POA: Diagnosis present

## 2021-03-16 DIAGNOSIS — N179 Acute kidney failure, unspecified: Secondary | ICD-10-CM | POA: Diagnosis present

## 2021-03-16 DIAGNOSIS — Z885 Allergy status to narcotic agent status: Secondary | ICD-10-CM

## 2021-03-16 DIAGNOSIS — Z8249 Family history of ischemic heart disease and other diseases of the circulatory system: Secondary | ICD-10-CM

## 2021-03-16 DIAGNOSIS — N12 Tubulo-interstitial nephritis, not specified as acute or chronic: Secondary | ICD-10-CM | POA: Diagnosis present

## 2021-03-16 DIAGNOSIS — Z881 Allergy status to other antibiotic agents status: Secondary | ICD-10-CM

## 2021-03-16 DIAGNOSIS — K219 Gastro-esophageal reflux disease without esophagitis: Secondary | ICD-10-CM | POA: Diagnosis present

## 2021-03-16 DIAGNOSIS — Z79899 Other long term (current) drug therapy: Secondary | ICD-10-CM

## 2021-03-16 LAB — COMPREHENSIVE METABOLIC PANEL
ALT: 28 U/L (ref 0–44)
AST: 28 U/L (ref 15–41)
Albumin: 4.3 g/dL (ref 3.5–5.0)
Alkaline Phosphatase: 124 U/L (ref 38–126)
Anion gap: 12 (ref 5–15)
BUN: 73 mg/dL — ABNORMAL HIGH (ref 8–23)
CO2: 28 mmol/L (ref 22–32)
Calcium: 10.4 mg/dL — ABNORMAL HIGH (ref 8.9–10.3)
Chloride: 115 mmol/L — ABNORMAL HIGH (ref 98–111)
Creatinine, Ser: 1.27 mg/dL — ABNORMAL HIGH (ref 0.44–1.00)
GFR, Estimated: 45 mL/min — ABNORMAL LOW (ref >60)
Glucose, Bld: 151 mg/dL — ABNORMAL HIGH (ref 70–99)
Potassium: 3.4 mmol/L — ABNORMAL LOW (ref 3.5–5.1)
Sodium: 155 mmol/L — ABNORMAL HIGH (ref 135–145)
Total Bilirubin: 0.8 mg/dL (ref 0.3–1.2)
Total Protein: 7.8 g/dL (ref 6.5–8.1)

## 2021-03-16 LAB — LACTIC ACID, PLASMA
Lactic Acid, Venous: 1.9 mmol/L (ref 0.5–1.9)
Lactic Acid, Venous: 1.6 mmol/L (ref 0.5–1.9)

## 2021-03-16 LAB — CBC WITH DIFFERENTIAL/PLATELET
Abs Immature Granulocytes: 0.07 10*3/uL (ref 0.00–0.07)
Basophils Absolute: 0.1 10*3/uL (ref 0.0–0.1)
Basophils Relative: 0 %
Eosinophils Absolute: 0 10*3/uL (ref 0.0–0.5)
Eosinophils Relative: 0 %
HCT: 47.9 % — ABNORMAL HIGH (ref 36.0–46.0)
Hemoglobin: 14.3 g/dL (ref 12.0–15.0)
Immature Granulocytes: 0 %
Lymphocytes Relative: 9 %
Lymphs Abs: 1.8 10*3/uL (ref 0.7–4.0)
MCH: 27 pg (ref 26.0–34.0)
MCHC: 29.9 g/dL — ABNORMAL LOW (ref 30.0–36.0)
MCV: 90.4 fL (ref 80.0–100.0)
Monocytes Absolute: 1.7 10*3/uL — ABNORMAL HIGH (ref 0.1–1.0)
Monocytes Relative: 9 %
Neutro Abs: 15.4 10*3/uL — ABNORMAL HIGH (ref 1.7–7.7)
Neutrophils Relative %: 82 %
Platelets: 402 10*3/uL — ABNORMAL HIGH (ref 150–400)
RBC: 5.3 MIL/uL — ABNORMAL HIGH (ref 3.87–5.11)
RDW: 13.4 % (ref 11.5–15.5)
WBC: 19.1 10*3/uL — ABNORMAL HIGH (ref 4.0–10.5)
nRBC: 0 % (ref 0.0–0.2)

## 2021-03-16 LAB — RESP PANEL BY RT-PCR (FLU A&B, COVID) ARPGX2
Influenza A by PCR: NEGATIVE
Influenza B by PCR: NEGATIVE
SARS Coronavirus 2 by RT PCR: NEGATIVE

## 2021-03-16 LAB — PROTIME-INR
INR: 1.2 (ref 0.8–1.2)
Prothrombin Time: 14.9 seconds (ref 11.4–15.2)

## 2021-03-16 LAB — APTT: aPTT: 27 seconds (ref 24–36)

## 2021-03-16 LAB — LIPASE, BLOOD: Lipase: 32 U/L (ref 11–51)

## 2021-03-16 MED ORDER — ENOXAPARIN SODIUM 40 MG/0.4ML IJ SOSY
40.0000 mg | PREFILLED_SYRINGE | INTRAMUSCULAR | Status: DC
  Administered 2021-03-16 – 2021-03-19 (×4): 40 mg via SUBCUTANEOUS
  Filled 2021-03-16 (×4): qty 0.4

## 2021-03-16 MED ORDER — ACETAMINOPHEN 650 MG RE SUPP: 650.0000 mg | Freq: Four times a day (QID) | RECTAL | Status: DC | PRN

## 2021-03-16 MED ORDER — PANTOPRAZOLE SODIUM 40 MG PO TBEC
80.0000 mg | DELAYED_RELEASE_TABLET | Freq: Every day | ORAL | Status: DC
  Administered 2021-03-17 – 2021-03-20 (×4): 80 mg via ORAL
  Filled 2021-03-16 (×4): qty 2

## 2021-03-16 MED ORDER — MORPHINE SULFATE (PF) 2 MG/ML IV SOLN: 1.0000 mg | INTRAVENOUS | Status: DC | PRN

## 2021-03-16 MED ORDER — LACTATED RINGERS IV BOLUS (SEPSIS)
1000.0000 mL | Freq: Once | INTRAVENOUS | Status: AC
  Administered 2021-03-16: 1000 mL via INTRAVENOUS

## 2021-03-16 MED ORDER — VERAPAMIL HCL ER 120 MG PO TBCR
120.0000 mg | EXTENDED_RELEASE_TABLET | Freq: Every day | ORAL | Status: DC
  Administered 2021-03-16 – 2021-03-19 (×4): 120 mg via ORAL
  Filled 2021-03-16 (×6): qty 1

## 2021-03-16 MED ORDER — ONDANSETRON HCL 4 MG/2ML IJ SOLN: 4.0000 mg | Freq: Four times a day (QID) | INTRAMUSCULAR | Status: DC | PRN

## 2021-03-16 MED ORDER — OXYCODONE-ACETAMINOPHEN 5-325 MG PO TABS: 1.0000 | ORAL_TABLET | ORAL | Status: DC | PRN

## 2021-03-16 MED ORDER — ALPRAZOLAM 0.5 MG PO TABS
0.5000 mg | ORAL_TABLET | Freq: Two times a day (BID) | ORAL | Status: DC | PRN
  Administered 2021-03-16 – 2021-03-18 (×3): 0.5 mg via ORAL
  Filled 2021-03-16 (×3): qty 1

## 2021-03-16 MED ORDER — SODIUM CHLORIDE 0.9 % IV SOLN
1.0000 g | Freq: Once | INTRAVENOUS | Status: AC
  Administered 2021-03-16: 1 g via INTRAVENOUS
  Filled 2021-03-16: qty 10

## 2021-03-16 MED ORDER — FLUTICASONE PROPIONATE 50 MCG/ACT NA SUSP
2.0000 | NASAL | Status: DC | PRN
  Filled 2021-03-16: qty 16

## 2021-03-16 MED ORDER — HYDRALAZINE HCL 10 MG PO TABS
10.0000 mg | ORAL_TABLET | Freq: Four times a day (QID) | ORAL | Status: DC | PRN
  Administered 2021-03-16: 10 mg via ORAL
  Filled 2021-03-16 (×3): qty 1

## 2021-03-16 MED ORDER — SODIUM CHLORIDE 0.9 % IV SOLN: 1.0000 g | Freq: Once | INTRAVENOUS | Status: DC

## 2021-03-16 MED ORDER — SODIUM CHLORIDE 0.9 % IV SOLN: 1.0000 g | INTRAVENOUS | Status: DC

## 2021-03-16 MED ORDER — LACTATED RINGERS IV SOLN: INTRAVENOUS | Status: AC

## 2021-03-16 MED ORDER — PHENAZOPYRIDINE HCL 200 MG PO TABS
200.0000 mg | ORAL_TABLET | Freq: Three times a day (TID) | ORAL | Status: DC | PRN
  Filled 2021-03-16: qty 1

## 2021-03-16 MED ORDER — LACTATED RINGERS IV BOLUS
1000.0000 mL | Freq: Once | INTRAVENOUS | Status: AC
  Administered 2021-03-16: 1000 mL via INTRAVENOUS

## 2021-03-16 MED ORDER — IOHEXOL 300 MG/ML  SOLN
60.0000 mL | Freq: Once | INTRAMUSCULAR | Status: AC | PRN
  Administered 2021-03-16: 60 mL via INTRAVENOUS

## 2021-03-16 MED ORDER — LACTATED RINGERS IV SOLN: INTRAVENOUS | Status: DC

## 2021-03-16 MED ORDER — SODIUM CHLORIDE 0.9 % IV SOLN
2.0000 g | INTRAVENOUS | Status: AC
  Administered 2021-03-17: 2 g via INTRAVENOUS
  Filled 2021-03-16: qty 2

## 2021-03-16 MED ORDER — ONDANSETRON HCL 4 MG PO TABS: 4.0000 mg | ORAL_TABLET | Freq: Four times a day (QID) | ORAL | Status: DC | PRN

## 2021-03-16 MED ORDER — VANCOMYCIN HCL IN DEXTROSE 1-5 GM/200ML-% IV SOLN
1000.0000 mg | Freq: Once | INTRAVENOUS | Status: AC
  Administered 2021-03-16: 1000 mg via INTRAVENOUS
  Filled 2021-03-16: qty 200

## 2021-03-16 MED ORDER — ONDANSETRON HCL 4 MG/2ML IJ SOLN
4.0000 mg | Freq: Once | INTRAMUSCULAR | Status: AC
  Administered 2021-03-16: 4 mg via INTRAVENOUS
  Filled 2021-03-16: qty 2

## 2021-03-16 MED ORDER — ACETAMINOPHEN 325 MG PO TABS
650.0000 mg | ORAL_TABLET | Freq: Four times a day (QID) | ORAL | Status: DC | PRN
  Administered 2021-03-19 (×3): 650 mg via ORAL
  Filled 2021-03-16 (×4): qty 2

## 2021-03-16 MED ORDER — POLYETHYLENE GLYCOL 3350 17 G PO PACK
17.0000 g | PACK | Freq: Two times a day (BID) | ORAL | Status: AC
Start: 2021-03-16 — End: 2021-03-17
  Administered 2021-03-17: 17 g via ORAL
  Filled 2021-03-16: qty 1

## 2021-03-16 NOTE — ED Notes (Addendum)
Pt c/o of anxiety and restlessness. Medication will be given. Husband remains at bedside

## 2021-03-16 NOTE — H&P (Signed)
History and Physical   COURTNEY BELLIZZI LPF:[id number] DOB: 08/08/1950 DOA: 03/16/2021  PCP: Adin Hector, MD  Outpatient Specialists: Dr. Kenton Kingfisher, OBGyn Patient coming from: Home via [place]  I have personally briefly reviewed patient's old medical records in [place].  Chief Concern: Abdominal pain  HPI: 71 year old female with history of anxiety, depression, GERD, hypertension, recent diagnosis of UTI outpatient, who presents emergency department for chief concerns of not feeling better and continued lower abdominal pain.  Initial vitals in the emergency department showed temperature of 98.6, respiration rate of 15, initial heart rate of 119 and improved to 95, blood pressure 152/95, SPO2 of 98% on room air.  Serum sodium was 155, potassium 3.4, chloride 115, bicarb 28, BUN of 73, serum creatinine of 1.27, nonfasting blood glucose 151, GFR 45, WBC was markedly elevated at 19.1, hemoglobin 14.3, platelets of 402.  In the emergency department patient was given ceftriaxone 1 g IV, lactated ringer fluid sepsis bolus, vancomycin per pharmacy ordered. _____  At bedside, she is able to tell me her name. She can identify her husband and she says she is at [facility]. She was not able to tell me her name or her age.  She does not appear to be in acute distress.  She started having back pain about two weeks ago.   Per patient's husband, patient's current mental status is her baseline.  He states that she has some memory decline in the few years.  She is able to point to me that the area of back pain is located in the lumbar region, at approximately L1-L3.  She reports the pain is dull.   She denies any chest pain, shortness of breath, diarrhea.  Social history: She lives at home with her husband. She is a former tobacco user, quitting in 1986. She denies etoh and recreational drug use. She formerly worked for a [employer].  ROS: Constitutional: no weight change, no fever ENT/Mouth:  no sore throat, no rhinorrhea Eyes: no eye pain, no vision changes Cardiovascular: no chest pain, no dyspnea,  no edema, no palpitations Respiratory: no cough, no sputum, no wheezing Gastrointestinal: no nausea, no vomiting, no diarrhea, no constipation Genitourinary: no urinary incontinence, no dysuria, no hematuria Musculoskeletal: no arthralgias, no myalgias Skin: no skin lesions, no pruritus, Neuro: + weakness, no loss of consciousness, no syncope Psych: no anxiety, no depression, + decrease appetite Heme/Lymph: no bruising, no bleeding  ED Course: Discussed with emergency medicine provider, patient requiring hospitalization for chief concerns of pyelonephritis.  Assessment/Plan  Principal Problem:   Pyelonephritis Active Problems:   Anxiety   Acid reflux   HLD (hyperlipidemia)   Leukocytosis   Constipation   Essential hypertension   AKI (acute kidney injury) ([place])   Severe sepsis with acute organ dysfunction due to group B Streptococcus (HCC)   * Pyelonephritis Assessment & Plan - Urine culture from outpatient setting on 03/12/2021 resulted in group B Streptococcus - Failed outpatient therapy with Macrobid - Status post ceftriaxone 1 g IV per EDP, vancomycin - Ordered additional ceftriaxone to complete 2 g IV  Essential hypertension Assessment & Plan - Verapamil 120 mg nightly resumed - Hydralazine 10 mg p.o. every 6 hours as needed for SBP greater than 175, 4 doses ordered  Acid reflux Assessment & Plan - PPI resumed  AKI (acute kidney injury) ([place]) Assessment & Plan - Serum creatinine on presentation is 1.27/45 - Baseline serum creatinine is 0.74- 0.89, GFR greater than 60 - No prior  CKD diagnosis  Severe sepsis with acute organ dysfunction due to group B Streptococcus [facility]) Assessment & Plan - Patient meets severe sepsis criteria for increased heart rate, markedly elevated leukocytosis, source of urine, and acute kidney injury - Continue ceftriaxone IV and  vancomycin per pharmacy - We will follow-up on blood cultures x2, urine culture  Constipation Assessment & Plan - GlycoLax twice daily ordered, 2 doses  Anxiety Assessment & Plan - Resumed home alprazolam 0.5 mg p.o. twice daily as needed for anxiety  Chart reviewed.   DVT prophylaxis: Enoxaparin Code Status: Full code Diet: Heart healthy Family Communication: Discussed and updated with spouse at bedside Disposition Plan: Pending clinical course Consults called: None at this time Admission status: Observation, telemetry medical  Past Medical History:  Diagnosis Date   Anemia    Anxiety    Arthritis    Cystocele with incomplete uterovaginal prolapse 06/08/2019   DVT (deep venous thrombosis) (HCC)    GERD (gastroesophageal reflux disease)    Hiatal hernia    Hyperlipemia    IBS (irritable bowel syndrome)    Lichen sclerosus [id number]   on bx   Uterine prolapse    Past Surgical History:  Procedure Laterality Date   ANKLE SURGERY Left 2010   torn ligament   BREAST BIOPSY Bilateral 2002   neg/ Dr Orlene Och), right done here- neg    BREAST BIOPSY Left 2022   [facility]/neg    BREAST CYST ASPIRATION Left 10/02/2014   cyst asp [facility] 6:00 4cmfn   CYSTOCELE REPAIR N/A 02/17/2021   Procedure: ANTERIOR REPAIR (CYSTOCELE);  Surgeon: Gae Dry, MD;  Location: [facility] ORS;  Service: Gynecology;  Laterality: N/A;   ESOPHAGOGASTRODUODENOSCOPY (EGD) WITH PROPOFOL N/A 09/19/2020   Procedure: ESOPHAGOGASTRODUODENOSCOPY (EGD) WITH PROPOFOL;  Surgeon: Lesly Rubenstein, MD;  Location: [facility] ENDOSCOPY;  Service: Endoscopy;  Laterality: N/A;   FINE NEEDLE ASPIRATION Left 10-01-14   benign   HAND SURGERY Bilateral 2014   SHOULDER SURGERY Right 2012   torn ligament   VAGINAL HYSTERECTOMY Bilateral 02/17/2021   Procedure: VAGINAL HYSTERECTOMY;  Surgeon: Gae Dry, MD;  Location: [facility] ORS;  Service: Gynecology;  Laterality: Bilateral;   Social History:  reports that she has never  smoked. She has never used smokeless tobacco. She reports that she does not drink alcohol and does not use drugs.  Allergies  Allergen Reactions   Azithromycin Other (See Comments)    Unknown reaction    Beclomethasone Other (See Comments)    unknown   Doxycycline Nausea Only   Erythromycin Base Nausea And Vomiting   Hydrocodone-Acetaminophen Nausea Only and Nausea And Vomiting   Lansoprazole Nausea And Vomiting   Levofloxacin In D5w Nausea Only   Penicillin V Potassium Hives   Prednisone Swelling   Sulfa Antibiotics Other (See Comments)    unknown   Amoxicillin Rash   Cefuroxime Axetil Rash   Codeine Palpitations   Family History  Problem Relation Age of Onset   Pancreatic cancer Mother    Diabetes Father    Hypertension Father    Diabetes Sister    Family history: Family history reviewed and not pertinent.  Prior to Admission medications   Medication Sig Start Date End Date Taking? Authorizing Provider  ALPRAZolam Duanne Moron) 0.5 MG tablet Take 0.5 mg by mouth in the morning and at bedtime.   Yes [provider]  nitrofurantoin, macrocrystal-monohydrate, (MACROBID) 100 MG capsule Take 1 capsule (100 mg total) by mouth 2 (two) times daily for 5 days. 03/12/21 03/17/21  Yes Imagene Riches, CNM  omeprazole (PRILOSEC) 40 MG capsule Take 40 mg by mouth in the morning. 12/29/20  Yes [provider]  oxyCODONE-acetaminophen (PERCOCET/ROXICET) 5-325 MG tablet Take 1 tablet by mouth every 4 (four) hours as needed for moderate pain. 02/17/21  Yes Gae Dry, MD  phenazopyridine (PYRIDIUM) 200 MG tablet Take 1 tablet (200 mg total) by mouth 3 (three) times daily as needed for pain (urethral spasm). 03/12/21  Yes Imagene Riches, CNM  verapamil (CALAN-SR) 120 MG CR tablet Take 120 mg by mouth daily after supper. 12/29/20  Yes [provider]  acetaminophen (TYLENOL) 500 MG tablet Take 500 mg by mouth daily as needed (pain.).    [provider]   fluticasone (FLONASE) 50 MCG/ACT nasal spray Place into both nostrils as needed for allergies or rhinitis.    [provider]   Physical Exam: Vitals:   03/16/21 2008 03/16/21 2200 03/16/21 2300 03/16/21 2330  BP: (!) 192/83 (!) 176/86 (!) 145/61 139/70  Pulse: 94 (!) 103 86 92  Resp: 18 19 16 15   Temp:      TempSrc:      SpO2: 97% 98% 97% 97%  Weight:      Height:       Constitutional: appears age-appropriate, frail, NAD, calm, comfortable Eyes: PERRL, lids and conjunctivae normal ENMT: Mucous membranes are moist. Posterior pharynx clear of any exudate or lesions. Age-appropriate dentition. Hearing appropriate Neck: normal, supple, no masses, no thyromegaly Respiratory: clear to auscultation bilaterally, no wheezing, no crackles. Normal respiratory effort. No accessory muscle use.  Cardiovascular: Regular rate and rhythm, no murmurs / rubs / gallops. No extremity edema. 2+ pedal pulses. No carotid bruits.  Abdomen: + Suprapubic tenderness, no masses palpated, no hepatosplenomegaly. Bowel sounds positive.  Musculoskeletal: no clubbing / cyanosis. No joint deformity upper and lower extremities. Good ROM, no contractures, no atrophy. Normal muscle tone.  Skin: no rashes, lesions, ulcers. No induration Neurologic: Sensation intact. Strength 5/5 in all 4.  Psychiatric: Normal judgment and insight. Alert and oriented x 3. Normal mood.   EKG: independently reviewed, showing sinus rhythm with rate of 99, QTc 483  Chest x-ray on Admission: I personally reviewed and I agree with radiologist reading as below.  DG Chest 2 View  Result Date: 03/16/2021 CLINICAL DATA:  Urinary tract infection, altered level of consciousness, weakness EXAM: CHEST - 2 VIEW COMPARISON:  06/17/2020 FINDINGS: Frontal and lateral views of the chest demonstrate a stable cardiac silhouette. No acute airspace disease, effusion, or pneumothorax. Left convex scoliosis centered at the thoracolumbar junction. No  acute bony abnormality. IMPRESSION: 1. No acute intrathoracic process. Electronically Signed   By: Randa Ngo M.D.   On: 03/16/2021 18:20   CT Abdomen Pelvis W Contrast  Result Date: 03/16/2021 CLINICAL DATA:  Abdominal pain EXAM: CT ABDOMEN AND PELVIS WITH CONTRAST TECHNIQUE: Multidetector CT imaging of the abdomen and pelvis was performed using the standard protocol following bolus administration of intravenous contrast. RADIATION DOSE REDUCTION: This exam was performed according to the departmental dose-optimization program which includes automated exposure control, adjustment of the mA and/or kV according to patient size and/or use of iterative reconstruction technique. CONTRAST:  45mL OMNIPAQUE IOHEXOL 300 MG/ML  SOLN COMPARISON:  11/14/2018 FINDINGS: Lower chest: No acute abnormality. Hepatobiliary: Hepatic steatosis. No focal hepatic lesion. No intra or extrahepatic biliary ductal dilatation. The hepatic and portal veins are patent. The gallbladder is somewhat distended but otherwise unremarkable. Pancreas: Unremarkable. No pancreatic ductal dilatation or surrounding  inflammatory changes. Spleen: Normal in size without focal abnormality. Adrenals/Urinary Tract: The adrenal glands are unremarkable. The kidneys enhance symmetrically with no hydronephrosis. The bladder is unremarkable for degree of distension. Stomach/Bowel: Stomach is within normal limits. Appendix appears normal. No evidence of bowel wall thickening, distention, or inflammatory changes. Diverticulosis without evidence of diverticulitis. In the rectum, there is a large amount of stool, which measures up to 7.2 cm, without evidence of stercoral colitis. The largest single discrete stool ball measures up to 5.1 x 5.7 x 5.9 cm. Vascular/Lymphatic: Aortic atherosclerosis. No enlarged abdominal or pelvic lymph nodes. Reproductive: Status post hysterectomy. Other: No free fluid or free air in the abdomen or pelvis. Musculoskeletal: S shaped  curvature of the thoracolumbar spine. Counting from the lowest rib-bearing vertebral body, T12, where there are diminutive ribs, there are four lumbar type vertebral bodies, with sacralization of L5. 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 Trace anterolisthesis of L4 on L5. IMPRESSION: 1. Rectal stool ball, which measures up to 5.7 cm in the axial plane, without evidence of stercoral colitis. 2. Transitional anatomy, with sacralization of L5. Please correlate with imaging if any spinal intervention is planned. Electronically Signed   By: Merilyn Baba M.D.   On: 03/16/2021 19:56    Labs on Admission: I have personally reviewed following labs  CBC: Recent Labs  Lab 03/16/21 1758  WBC 19.1*  NEUTROABS 15.4*  HGB 14.3  HCT 47.9*  MCV 90.4  PLT 329*   Basic Metabolic Panel: Recent Labs  Lab 03/16/21 1758  NA 155*  K 3.4*  CL 115*  CO2 28  GLUCOSE 151*  BUN 73*  CREATININE 1.27*  CALCIUM 10.4*   GFR: Estimated Creatinine Clearance: 32 mL/min (A) (by C-G formula based on SCr of 1.27 mg/dL (H)).  Liver Function Tests: Recent Labs  Lab 03/16/21 1758  AST 28  ALT 28  ALKPHOS 124  BILITOT 0.8  PROT 7.8  ALBUMIN 4.3   Recent Labs  Lab 03/16/21 1758  LIPASE 32   Coagulation Profile: Recent Labs  Lab 03/16/21 1758  INR 1.2   Urine analysis:    Component Value Date/Time   COLORURINE YELLOW (A) 06/17/2020 1115   APPEARANCEUR HAZY (A) 06/17/2020 1115   LABSPEC 1.011 06/17/2020 1115   PHURINE 7.0 06/17/2020 1115   GLUCOSEU NEGATIVE 06/17/2020 1115   HGBUR NEGATIVE 06/17/2020 1115   BILIRUBINUR neg 03/12/2021 1340   Williams 06/17/2020 1115   PROTEINUR Negative 03/12/2021 1340   PROTEINUR NEGATIVE 06/17/2020 1115   UROBILINOGEN negative (A) 03/12/2021 1340   NITRITE neg 03/12/2021 1340   NITRITE NEGATIVE 06/17/2020 1115   LEUKOCYTESUR Moderate (2+) (A) 03/12/2021 1340   LEUKOCYTESUR LARGE (A) 06/17/2020 1115   Dr.  Tobie Poet [facility]  If 7PM-7AM, please contact overnight-coverage provider If 7AM-7PM, please contact day coverage provider [email or web address]  03/17/2021, 12:44 AM

## 2021-03-16 NOTE — Assessment & Plan Note (Signed)
-   Resumed home alprazolam 0.5 mg p.o. twice daily as needed for anxiety

## 2021-03-16 NOTE — Assessment & Plan Note (Signed)
-   Serum creatinine on presentation is 1.27/45 - Baseline serum creatinine is 0.74- 0.89, GFR greater than 60 - No prior CKD diagnosis

## 2021-03-16 NOTE — Assessment & Plan Note (Signed)
-   PPI resumed °

## 2021-03-16 NOTE — Assessment & Plan Note (Addendum)
-   Verapamil 120 mg nightly resumed - Hydralazine 10 mg p.o. every 6 hours as needed for SBP greater than 175, 4 doses ordered

## 2021-03-16 NOTE — Assessment & Plan Note (Signed)
-   Patient meets severe sepsis criteria for increased heart rate, markedly elevated leukocytosis, source of urine, and acute kidney injury - Continue ceftriaxone IV and vancomycin per pharmacy - We will follow-up on blood cultures x2, urine culture

## 2021-03-16 NOTE — ED Notes (Signed)
Pt placed on purewick 

## 2021-03-16 NOTE — Hospital Course (Addendum)
71 year old female with history of anxiety, depression, GERD, hypertension, recent diagnosis of UTI outpatient, who presents emergency department for chief concerns of not feeling better and continued lower abdominal pain.  Initial vitals in the emergency department showed temperature of 98.6, respiration rate of 15, initial heart rate of 119 and improved to 95, blood pressure 152/95, SPO2 of 98% on room air.  Serum sodium was 155, potassium 3.4, chloride 115, bicarb 28, BUN of 73, serum creatinine of 1.27, nonfasting blood glucose 151, GFR 45, WBC was markedly elevated at 19.1, hemoglobin 14.3, platelets of 402.  In the emergency department patient was given ceftriaxone 1 g IV, lactated ringer fluid sepsis bolus, vancomycin per pharmacy ordered.

## 2021-03-16 NOTE — ED Provider Notes (Signed)
[facility] Provider Note    Event Date/Time   First MD Initiated Contact with Patient 03/16/21 1920     (approximate)   History   Urinary Frequency and Dehydration   HPI  Janet Phillips is a 71 y.o. female who presents to the ED for evaluation of Urinary Frequency and Dehydration   Review outpatient OB/GYN visit from 1/26 patient was seen for urinary frequency and urgency for a few days.  She was started on Macrobid and urine sent for culture. Urine culture from 1/26 reviewed with 50k-100k group B strep.  Patient presents to the ED for evaluation of concerns for dehydration.  Husband provides majority of history as patient feels quite weak overall.  She reports continued urinary frequency, urgency despite adherence to Macrobid.  Denies fevers, syncope or falls.  Denies emesis, diarrhea.  Reports developing right flank pain and worsening generalized weakness over the past couple days.  Physical Exam   Triage Vital Signs: ED Triage Vitals  Enc Vitals Group     BP 03/16/21 1743 (!) 152/95     Pulse Rate 03/16/21 1743 (!) 119     Resp 03/16/21 1743 18     Temp 03/16/21 1743 98.6 F (37 C)     Temp Source 03/16/21 1743 Oral     SpO2 03/16/21 1743 (!) 86 %     Weight 03/16/21 1744 110 lb (49.9 kg)     Height 03/16/21 1744 5\' 2"  (1.575 m)     Head Circumference --      Peak Flow --      Pain Score --      Pain Loc --      Pain Edu? --      Excl. in [place]? --     Most recent vital signs: Vitals:   03/16/21 1743 03/16/21 1803  BP: (!) 152/95   Pulse: (!) 119   Resp: 18   Temp: 98.6 F (37 C)   SpO2: (!) 86% 94%    General: Awake, no distress.  CV:  Good peripheral perfusion.  Tachycardic and regular. Resp:  Normal effort.  CTAB. Abd:  No distention.  Suprapubic and right CVA tenderness is present.  No peritoneal features.  No upper abdominal tenderness. MSK:  No deformity noted.  Neuro:  No focal deficits appreciated. Other:     ED  Results / Procedures / Treatments   Labs (all labs ordered are listed, but only abnormal results are displayed) Labs Reviewed  CBC WITH DIFFERENTIAL/PLATELET - Abnormal; Notable for the following components:      Result Value   WBC 19.1 (*)    RBC 5.30 (*)    HCT 47.9 (*)    MCHC 29.9 (*)    Platelets 402 (*)    Neutro Abs 15.4 (*)    Monocytes Absolute 1.7 (*)    All other components within normal limits  COMPREHENSIVE METABOLIC PANEL - Abnormal; Notable for the following components:   Sodium 155 (*)    Potassium 3.4 (*)    Chloride 115 (*)    Glucose, Bld 151 (*)    BUN 73 (*)    Creatinine, Ser 1.27 (*)    Calcium 10.4 (*)    GFR, Estimated 45 (*)    All other components within normal limits  CULTURE, BLOOD (ROUTINE X 2)  CULTURE, BLOOD (ROUTINE X 2)  RESP PANEL BY RT-PCR (FLU A&B, COVID) ARPGX2  URINE CULTURE  LIPASE, BLOOD  LACTIC ACID, PLASMA  PROTIME-INR  APTT  URINALYSIS, ROUTINE W REFLEX MICROSCOPIC  LACTIC ACID, PLASMA  URINALYSIS, COMPLETE (UACMP) WITH MICROSCOPIC    EKG  Poor quality EKG, sinus rhythm, rate of 99 bpm.  Normal axis and intervals and without clear evidence of acute ischemia.  RADIOLOGY CXR reviewed by me without evidence of acute cardiopulmonary pathology.  Official radiology report(s): DG Chest 2 View  Result Date: 03/16/2021 CLINICAL DATA:  Urinary tract infection, altered level of consciousness, weakness EXAM: CHEST - 2 VIEW COMPARISON:  06/17/2020 FINDINGS: Frontal and lateral views of the chest demonstrate a stable cardiac silhouette. No acute airspace disease, effusion, or pneumothorax. Left convex scoliosis centered at the thoracolumbar junction. No acute bony abnormality. IMPRESSION: 1. No acute intrathoracic process. Electronically Signed   By: Randa Ngo M.D.   On: 03/16/2021 18:20   CT Abdomen Pelvis W Contrast  Result Date: 03/16/2021 CLINICAL DATA:  Abdominal pain EXAM: CT ABDOMEN AND PELVIS WITH CONTRAST TECHNIQUE:  Multidetector CT imaging of the abdomen and pelvis was performed using the standard protocol following bolus administration of intravenous contrast. RADIATION DOSE REDUCTION: This exam was performed according to the departmental dose-optimization program which includes automated exposure control, adjustment of the mA and/or kV according to patient size and/or use of iterative reconstruction technique. CONTRAST:  45mL OMNIPAQUE IOHEXOL 300 MG/ML  SOLN COMPARISON:  11/14/2018 FINDINGS: Lower chest: No acute abnormality. Hepatobiliary: Hepatic steatosis. No focal hepatic lesion. No intra or extrahepatic biliary ductal dilatation. The hepatic and portal veins are patent. The gallbladder is somewhat distended but otherwise unremarkable. Pancreas: Unremarkable. No pancreatic ductal dilatation or surrounding inflammatory changes. Spleen: Normal in size without focal abnormality. Adrenals/Urinary Tract: The adrenal glands are unremarkable. The kidneys enhance symmetrically with no hydronephrosis. The bladder is unremarkable for degree of distension. Stomach/Bowel: Stomach is within normal limits. Appendix appears normal. No evidence of bowel wall thickening, distention, or inflammatory changes. Diverticulosis without evidence of diverticulitis. In the rectum, there is a large amount of stool, which measures up to 7.2 cm, without evidence of stercoral colitis. The largest single discrete stool ball measures up to 5.1 x 5.7 x 5.9 cm. Vascular/Lymphatic: Aortic atherosclerosis. No enlarged abdominal or pelvic lymph nodes. Reproductive: Status post hysterectomy. Other: No free fluid or free air in the abdomen or pelvis. Musculoskeletal: S shaped curvature of the thoracolumbar spine. Counting from the lowest rib-bearing vertebral body, T12, where there are diminutive ribs, there are four lumbar type vertebral bodies, with sacralization of L5. 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 Trace  anterolisthesis of L4 on L5. IMPRESSION: 1. Rectal stool ball, which measures up to 5.7 cm in the axial plane, without evidence of stercoral colitis. 2. Transitional anatomy, with sacralization of L5. Please correlate with imaging if any spinal intervention is planned. Electronically Signed   By: Merilyn Baba M.D.   On: 03/16/2021 19:56    PROCEDURES and INTERVENTIONS:  .1-3 Lead EKG Interpretation Performed by: Vladimir Crofts, MD Authorized by: Vladimir Crofts, MD     Interpretation: abnormal     ECG rate:  101   ECG rate assessment: tachycardic     Rhythm: sinus tachycardia     Ectopy: none     Conduction: normal   .Critical Care Performed by: Vladimir Crofts, MD Authorized by: Vladimir Crofts, MD   Critical care provider statement:    Critical care time (minutes):  30   Critical care time was exclusive of:  Separately billable procedures and treating other patients  Critical care was necessary to treat or prevent imminent or life-threatening deterioration of the following conditions:  Sepsis   Critical care was time spent personally by me on the following activities:  Development of treatment plan with patient or surrogate, discussions with consultants, evaluation of patient's response to treatment, examination of patient, ordering and review of laboratory studies, ordering and review of radiographic studies, ordering and performing treatments and interventions, pulse oximetry, re-evaluation of patient's condition and review of old charts  Medications  lactated ringers infusion (has no administration in time range)  vancomycin (VANCOCIN) IVPB 1000 mg/200 mL premix (has no administration in time range)  cefTRIAXone (ROCEPHIN) 1 g in sodium chloride 0.9 % 100 mL IVPB (has no administration in time range)  lactated ringers bolus 1,000 mL (has no administration in time range)  ondansetron (ZOFRAN) injection 4 mg (has no administration in time range)  lactated ringers bolus 1,000 mL (1,000 mLs  Intravenous New Bag/Given 03/16/21 1819)  iohexol (OMNIPAQUE) 300 MG/ML solution 60 mL (60 mLs Intravenous Contrast Given 03/16/21 1921)     IMPRESSION / MDM / [place] / ED COURSE  I reviewed the triage vital signs and the nursing notes.  71 year old female presents to the ED with evidence of sepsis from pyelonephritis requiring medical admission.  Cardiac but hemodynamically stable, improving clinically with fluid resuscitation.  Blood work with leukocytosis, hyponatremia and prerenal AKI.  No lactic acidosis or evidence of shock.  CXR without infiltrate and CT abdomen/pelvis without evidence of urologic obstruction or radiographic evidence of Pyelo, but her clinical picture is most consistent with right-sided pyelonephritis.  We will initiate IV antibiotics and consult with medicine for admission.  Clinical Course as of 03/16/21 2003  Mon Mar 16, 2021  2001 Reassessed. HR has improved and I ask RN to update vitals [DS]    Clinical Course User Index [DS] Vladimir Crofts, MD     FINAL CLINICAL IMPRESSION(S) / ED DIAGNOSES   Final diagnoses:  Sepsis with acute renal failure and tubular necrosis without septic shock, due to unspecified organism [facility])  Pyelonephritis     Rx / DC Orders   ED Discharge Orders     None        Note:  This document was prepared using Dragon voice recognition software and may include unintentional dictation errors.    Vladimir Crofts, MD 03/16/21 2004

## 2021-03-16 NOTE — Assessment & Plan Note (Signed)
-   GlycoLax twice daily ordered, 2 doses

## 2021-03-16 NOTE — ED Notes (Signed)
Hydralazine ordered from pharmacy

## 2021-03-16 NOTE — ED Triage Notes (Signed)
Pt via POV from home. Pt was dx with a UTI and per family pt has not been getting any better. Pt still has lower abd pain and urinary frequency. Pt is alert but oriented to only self.   Pt O2 sat on arrival 86%, pt placed on 2L [place] and increased to 94% on RA.

## 2021-03-16 NOTE — Consult Note (Signed)
CODE SEPSIS - PHARMACY COMMUNICATION  **Broad Spectrum Antibiotics should be administered within 1 hour of Sepsis diagnosis**  Time Code Sepsis Called/Page Received: 6440  per d/w MD, utilize vancomycin load in place of Merrem for sepsis call w/ suspected urinary source (GBS) ISO PCN allergy. Order changed to reflect. Provider placed order for Rocephin. Antibiotics Ordered: 1830  Time of 1st antibiotic administration: 2005  Additional action taken by pharmacy: Notified RN at 1831 of change and to assist triage in case of stock outs or delays.  If necessary, Name of Provider/Nurse Contacted: Thayer Headings  Lorna Dibble, PharmD, [facility] Clinical Pharmacist 03/16/2021 8:36 PM

## 2021-03-16 NOTE — Assessment & Plan Note (Signed)
-   Urine culture from outpatient setting on 03/12/2021 resulted in group B Streptococcus - Failed outpatient therapy with Macrobid - Status post ceftriaxone 1 g IV per EDP, vancomycin - Ordered additional ceftriaxone to complete 2 g IV

## 2021-03-16 NOTE — Progress Notes (Signed)
Elink following code sepsis °

## 2021-03-16 NOTE — ED Provider Triage Note (Signed)
Emergency Medicine Provider Triage Evaluation Note  Janet Phillips , a 71 y.o. female  was evaluated in triage.  Patient presents with husband for evaluation of painful urination.  She was seen by GYN physician on 03/12/2021, diagnosed with UTI, cultures obtained.  Patient started on Macrobid.  They presented to the walk-in clinic today due to continued symptoms of increasing frequency, abdominal pain.  Blood work obtained showing significantly elevated white count.  Husband states patient has dementia at baseline, she is a difficult historian.  This has been present for least 6 months.  Review of Systems  Positive: Increase in urinary frequency, dysuria Negative: Fevers, chest pain, shortness of breath, cough  Physical Exam  BP (!) 152/95 (BP Location: Left Arm)    Pulse (!) 119    Temp 98.6 F (37 C) (Oral)    Resp 18    Ht 5\' 2"  (1.575 m)    Wt 49.9 kg    SpO2 (!) 86%    BMI 20.12 kg/m  Gen:   Awake, no distress presents in a wheelchair, confused Resp:  Normal effort no respiratory distress MSK:   Moves extremities without difficulty  Other:    Medical Decision Making  Medically screening exam initiated at 5:48 PM.  Appropriate orders placed.  Janet Phillips was informed that the remainder of the evaluation will be completed by another provider, this initial triage assessment does not replace that evaluation, and the importance of remaining in the ED until their evaluation is complete.     Duanne Guess, [place] 03/16/21 1752

## 2021-03-16 NOTE — Consult Note (Signed)
PHARMACY -  BRIEF ANTIBIOTIC NOTE   Pharmacy has received consult(s) for vancomycin from an ED provider.  The patient's profile has been reviewed for ht/wt/allergies/indication/available labs.    One time order(s) placed for: Per d/w MD, utilize vancomycin load for sepsis call w/ suspected urinary source (GBS) ISO PCN allergy. Vancomycin 1g IV x1 in ED  Further antibiotics/pharmacy consults should be ordered by admitting physician if indicated.                       Thank you, Lorna Dibble 03/16/2021  6:27 PM

## 2021-03-17 DIAGNOSIS — R6520 Severe sepsis without septic shock: Secondary | ICD-10-CM

## 2021-03-17 DIAGNOSIS — N179 Acute kidney failure, unspecified: Secondary | ICD-10-CM | POA: Diagnosis present

## 2021-03-17 DIAGNOSIS — E870 Hyperosmolality and hypernatremia: Secondary | ICD-10-CM

## 2021-03-17 DIAGNOSIS — A401 Sepsis due to streptococcus, group B: Secondary | ICD-10-CM | POA: Diagnosis present

## 2021-03-17 DIAGNOSIS — K589 Irritable bowel syndrome without diarrhea: Secondary | ICD-10-CM | POA: Diagnosis present

## 2021-03-17 DIAGNOSIS — Z20822 Contact with and (suspected) exposure to covid-19: Secondary | ICD-10-CM | POA: Diagnosis present

## 2021-03-17 DIAGNOSIS — N10 Acute tubulo-interstitial nephritis: Secondary | ICD-10-CM | POA: Diagnosis present

## 2021-03-17 DIAGNOSIS — E440 Moderate protein-calorie malnutrition: Secondary | ICD-10-CM | POA: Diagnosis present

## 2021-03-17 DIAGNOSIS — N12 Tubulo-interstitial nephritis, not specified as acute or chronic: Secondary | ICD-10-CM | POA: Diagnosis present

## 2021-03-17 DIAGNOSIS — J9601 Acute respiratory failure with hypoxia: Secondary | ICD-10-CM | POA: Diagnosis present

## 2021-03-17 DIAGNOSIS — E876 Hypokalemia: Secondary | ICD-10-CM

## 2021-03-17 DIAGNOSIS — F32A Depression, unspecified: Secondary | ICD-10-CM | POA: Diagnosis present

## 2021-03-17 DIAGNOSIS — K5900 Constipation, unspecified: Secondary | ICD-10-CM | POA: Diagnosis present

## 2021-03-17 DIAGNOSIS — Z87891 Personal history of nicotine dependence: Secondary | ICD-10-CM | POA: Diagnosis not present

## 2021-03-17 DIAGNOSIS — Z880 Allergy status to penicillin: Secondary | ICD-10-CM | POA: Diagnosis not present

## 2021-03-17 DIAGNOSIS — Z881 Allergy status to other antibiotic agents status: Secondary | ICD-10-CM | POA: Diagnosis not present

## 2021-03-17 DIAGNOSIS — Z8249 Family history of ischemic heart disease and other diseases of the circulatory system: Secondary | ICD-10-CM | POA: Diagnosis not present

## 2021-03-17 DIAGNOSIS — Z882 Allergy status to sulfonamides status: Secondary | ICD-10-CM | POA: Diagnosis not present

## 2021-03-17 DIAGNOSIS — F0394 Unspecified dementia, unspecified severity, with anxiety: Secondary | ICD-10-CM | POA: Diagnosis present

## 2021-03-17 DIAGNOSIS — Z888 Allergy status to other drugs, medicaments and biological substances status: Secondary | ICD-10-CM | POA: Diagnosis not present

## 2021-03-17 DIAGNOSIS — Z90710 Acquired absence of both cervix and uterus: Secondary | ICD-10-CM | POA: Diagnosis not present

## 2021-03-17 DIAGNOSIS — Z885 Allergy status to narcotic agent status: Secondary | ICD-10-CM | POA: Diagnosis not present

## 2021-03-17 DIAGNOSIS — K219 Gastro-esophageal reflux disease without esophagitis: Secondary | ICD-10-CM | POA: Diagnosis present

## 2021-03-17 DIAGNOSIS — I10 Essential (primary) hypertension: Secondary | ICD-10-CM | POA: Diagnosis present

## 2021-03-17 DIAGNOSIS — E785 Hyperlipidemia, unspecified: Secondary | ICD-10-CM | POA: Diagnosis present

## 2021-03-17 DIAGNOSIS — E860 Dehydration: Secondary | ICD-10-CM | POA: Diagnosis present

## 2021-03-17 LAB — CBC WITH DIFFERENTIAL/PLATELET
Abs Immature Granulocytes: 0.06 10*3/uL (ref 0.00–0.07)
Basophils Absolute: 0.1 10*3/uL (ref 0.0–0.1)
Basophils Relative: 1 %
Eosinophils Absolute: 0.1 10*3/uL (ref 0.0–0.5)
Eosinophils Relative: 0 %
HCT: 39 % (ref 36.0–46.0)
Hemoglobin: 12 g/dL (ref 12.0–15.0)
Immature Granulocytes: 1 %
Lymphocytes Relative: 14 %
Lymphs Abs: 1.9 10*3/uL (ref 0.7–4.0)
MCH: 26.7 pg (ref 26.0–34.0)
MCHC: 30.8 g/dL (ref 30.0–36.0)
MCV: 86.7 fL (ref 80.0–100.0)
Monocytes Absolute: 1.1 10*3/uL — ABNORMAL HIGH (ref 0.1–1.0)
Monocytes Relative: 9 %
Neutro Abs: 9.7 10*3/uL — ABNORMAL HIGH (ref 1.7–7.7)
Neutrophils Relative %: 75 %
Platelets: 247 10*3/uL (ref 150–400)
RBC: 4.5 MIL/uL (ref 3.87–5.11)
RDW: 13.5 % (ref 11.5–15.5)
WBC: 12.9 10*3/uL — ABNORMAL HIGH (ref 4.0–10.5)
nRBC: 0 % (ref 0.0–0.2)

## 2021-03-17 LAB — BASIC METABOLIC PANEL
Anion gap: 8 (ref 5–15)
BUN: 50 mg/dL — ABNORMAL HIGH (ref 8–23)
CO2: 29 mmol/L (ref 22–32)
Calcium: 9.3 mg/dL (ref 8.9–10.3)
Chloride: 116 mmol/L — ABNORMAL HIGH (ref 98–111)
Creatinine, Ser: 0.87 mg/dL (ref 0.44–1.00)
GFR, Estimated: >60 mL/min (ref >60)
Glucose, Bld: 114 mg/dL — ABNORMAL HIGH (ref 70–99)
Potassium: 3.1 mmol/L — ABNORMAL LOW (ref 3.5–5.1)
Sodium: 153 mmol/L — ABNORMAL HIGH (ref 135–145)

## 2021-03-17 LAB — URINALYSIS, COMPLETE (UACMP) WITH MICROSCOPIC
Bilirubin Urine: NEGATIVE
Glucose, UA: 100 mg/dL — AB
Ketones, ur: NEGATIVE mg/dL
Nitrite: POSITIVE — AB
Protein, ur: 30 mg/dL — AB
Specific Gravity, Urine: 1.01 (ref 1.005–1.030)
WBC, UA: >50 WBC/hpf (ref 0–5)
pH: 6.5 (ref 5.0–8.0)

## 2021-03-17 LAB — PROTIME-INR
INR: 1.2 (ref 0.8–1.2)
Prothrombin Time: 15.4 seconds — ABNORMAL HIGH (ref 11.4–15.2)

## 2021-03-17 LAB — PHOSPHORUS: Phosphorus: 2.4 mg/dL — ABNORMAL LOW (ref 2.5–4.6)

## 2021-03-17 LAB — MAGNESIUM: Magnesium: 2.7 mg/dL — ABNORMAL HIGH (ref 1.7–2.4)

## 2021-03-17 LAB — CORTISOL-AM, BLOOD: Cortisol - AM: 21.9 ug/dL (ref 6.7–22.6)

## 2021-03-17 LAB — PROCALCITONIN: Procalcitonin: <0.1 ng/mL

## 2021-03-17 MED ORDER — SENNOSIDES-DOCUSATE SODIUM 8.6-50 MG PO TABS
2.0000 | ORAL_TABLET | Freq: Two times a day (BID) | ORAL | Status: DC
  Administered 2021-03-17 – 2021-03-19 (×6): 2 via ORAL
  Filled 2021-03-17 (×7): qty 2

## 2021-03-17 MED ORDER — POTASSIUM CL IN DEXTROSE 5% 20 MEQ/L IV SOLN
20.0000 meq | INTRAVENOUS | Status: AC
  Administered 2021-03-17: 20 meq via INTRAVENOUS
  Filled 2021-03-17: qty 1000

## 2021-03-17 MED ORDER — LACTULOSE 10 GM/15ML PO SOLN: 20.0000 g | Freq: Once | ORAL | Status: DC

## 2021-03-17 MED ORDER — POTASSIUM CHLORIDE 10 MEQ/100ML IV SOLN
10.0000 meq | INTRAVENOUS | Status: AC
  Administered 2021-03-17 (×2): 10 meq via INTRAVENOUS
  Filled 2021-03-17 (×2): qty 100

## 2021-03-17 NOTE — ED Notes (Signed)
Pt husband came out of room and is very upset on the care that his wife is getting. Husband sts " No one hooked up my wifes suction thing so I had to do it so she could urinate. I am not even sure if it is hooked up correctly. Also, there is not even any IVF going, they are just hanging here and not even running. There has not been one think done in 6hrs. I want to talk to someone about this now." RN expressed sympathy and notified charge nurse and notified provider.

## 2021-03-17 NOTE — ED Notes (Signed)
RN provided pt with two orange juices.

## 2021-03-17 NOTE — Progress Notes (Addendum)
PROGRESS NOTE    Janet Phillips  WUJ:[id number] DOB: 30-Jan-1951 DOA: 03/16/2021 PCP: Adin Hector, MD   Chief complaint.  Abdominal pain. Brief Narrative:  71 year old female with history of anxiety, depression, GERD, hypertension, recent diagnosis of UTI outpatient, who presents emergency department for chief concerns of not feeling better and continued lower abdominal pain. Patient was recently diagnosed with UTI, urine culture grow Streptococcus, group B on 1/26, patient was treated with Bactrim. Patient also has a very poor appetite, not be eating or drinking.  Has not had a bowel movement for a week. Upon arriving the hospital, patient was placed on Rocephin.   Assessment & Plan:   Principal Problem:   Pyelonephritis Active Problems:   Anxiety   Acid reflux   HLD (hyperlipidemia)   Leukocytosis   Constipation   Essential hypertension   AKI (acute kidney injury) ([place])   Severe sepsis with acute organ dysfunction due to group B Streptococcus (HCC)   Acute pyelonephritis  Group B strep pyelonephritis. Severe sepsis. Patient meet sepsis criteria at time admission, patient has significant leukocytosis, tachycardia and acute kidney injury.  Lactic acid level was not elevated. Need to rule out a bacteremia.  Blood culture has been sent out, pending results. Patient currently covered with 2 g of Rocephin daily in case patient has bacteremia.  Hypernatremia. Hypokalemia. Acute kidney injury secondary to dehydration. This appears to be secondary to dehydration due to poor p.o. intake intake. Patient has severe hypernatremia, will start D5 water with added potassium.  We will also give IV potassium separately. Recheck BMP, magnesium in the morning.  Severe constipation. Patient has not had a bowel movement for the least a week, will give lactulose and senna.  Dementia. Continue to follow.   DVT prophylaxis: Lovenox Code Status: full Family Communication: Husband  updated at bedside. Disposition Plan:    Status is: Inpatient Remains inpatient appropriate because: Severity of disease, IV treatment.  Planned Discharge Destination:  Be determined.          I/O last 3 completed shifts: In: 2291.1 [IV Piggyback:2291.1] Out: -  No intake/output data recorded.     Consultants:  None  Procedures: None  Antimicrobials: Rocephin  Subjective: Patient has baseline confusion, no agitation. Denies any abdominal pain or nausea vomiting, has not had a bowel movement for at least a week.  Poor appetite. No short of breath or cough.  Objective: Vitals:   03/17/21 1215 03/17/21 1230 03/17/21 1245 03/17/21 1300  BP:  (!) 138/54  134/64  Pulse: (!) 101 91 85 86  Resp: 17 19 18 15   Temp:      TempSrc:      SpO2: 97% 93% 96% 96%  Weight:      Height:        Intake/Output Summary (Last 24 hours) at 03/17/2021 1309 Last data filed at 03/16/2021 2155 Gross per 24 hour  Intake 2291.09 ml  Output --  Net 2291.09 ml   Filed Weights   03/16/21 1744  Weight: 49.9 kg    Examination:  General exam: Appears calm and comfortable  Respiratory system: Clear to auscultation. Respiratory effort normal. Cardiovascular system: S1 & S2 heard, RRR. No JVD, murmurs, rubs, gallops or clicks. No pedal edema. Gastrointestinal system: Abdomen is nondistended, soft and nontender. No organomegaly or masses felt. Normal bowel sounds heard. Central nervous system: Alert and oriented x1. No focal neurological deficits. Extremities: Symmetric 5 x 5 power. Skin: No rashes, lesions or ulcers  Data Reviewed: I have personally reviewed following labs and imaging studies  CBC: Recent Labs  Lab 03/16/21 1758 03/17/21 0736  WBC 19.1* 12.9*  NEUTROABS 15.4* 9.7*  HGB 14.3 12.0  HCT 47.9* 39.0  MCV 90.4 86.7  PLT 402* 962   Basic Metabolic Panel: Recent Labs  Lab 03/16/21 1758 03/17/21 0736  NA 155* 153*  K 3.4* 3.1*  CL 115* 116*  CO2 28 29   GLUCOSE 151* 114*  BUN 73* 50*  CREATININE 1.27* 0.87  CALCIUM 10.4* 9.3  MG  --  2.7*  PHOS  --  2.4*   GFR: Estimated Creatinine Clearance: 46.7 mL/min (by C-G formula based on SCr of 0.87 mg/dL). Liver Function Tests: Recent Labs  Lab 03/16/21 1758  AST 28  ALT 28  ALKPHOS 124  BILITOT 0.8  PROT 7.8  ALBUMIN 4.3   Recent Labs  Lab 03/16/21 1758  LIPASE 32   No results for input(s): AMMONIA in the last 168 hours. Coagulation Profile: Recent Labs  Lab 03/16/21 1758 03/17/21 0736  INR 1.2 1.2   Cardiac Enzymes: No results for input(s): CKTOTAL, CKMB, CKMBINDEX, TROPONINI in the last 168 hours. BNP (last 3 results) No results for input(s): PROBNP in the last 8760 hours. HbA1C: No results for input(s): HGBA1C in the last 72 hours. CBG: No results for input(s): GLUCAP in the last 168 hours. Lipid Profile: No results for input(s): CHOL, HDL, LDLCALC, TRIG, CHOLHDL, LDLDIRECT in the last 72 hours. Thyroid Function Tests: No results for input(s): TSH, T4TOTAL, FREET4, T3FREE, THYROIDAB in the last 72 hours. Anemia Panel: No results for input(s): VITAMINB12, FOLATE, FERRITIN, TIBC, IRON, RETICCTPCT in the last 72 hours. Sepsis Labs: Recent Labs  Lab 03/16/21 1758 03/16/21 1945 03/17/21 0736  PROCALCITON  --   --  <0.10  LATICACIDVEN 1.9 1.6  --     Recent Results (from the past 240 hour(s))  Urine Culture     Status: Abnormal   Collection Time: 03/12/21  2:40 AM   Specimen: Urine   UC  Result Value Ref Range Status   Urine Culture, Routine Final report (A)  Final   Organism ID, Bacteria Comment (A)  Final    Comment: Beta hemolytic Streptococcus, group B Penicillin and ampicillin are drugs of choice for treatment of beta-hemolytic streptococcal infections. Susceptibility testing of penicillins and other beta-lactam agents approved by the FDA for treatment of beta-hemolytic streptococcal infections need not be performed routinely because nonsusceptible  isolates are extremely rare in any beta-hemolytic streptococcus and have not been reported for Streptococcus pyogenes (group A). (CLSI) 50,000-100,000 colony forming units per mL    ORGANISM ID, BACTERIA Comment  Final    Comment: Mixed urogenital flora Less than 10,000 colonies/mL   Blood culture (routine x 2)     Status: None (Preliminary result)   Collection Time: 03/16/21  5:58 PM   Specimen: BLOOD  Result Value Ref Range Status   Specimen Description BLOOD LEFT ANTECUBITAL  Final   Special Requests   Final    BOTTLES DRAWN AEROBIC AND ANAEROBIC Blood Culture adequate volume   Culture   Final    NO GROWTH < 24 HOURS Performed at [facility], [address] [place], [place] [address]    Report Status PENDING  Incomplete  Culture, blood (single) w Reflex to ID Panel     Status: None (Preliminary result)   Collection Time: 03/16/21  7:44 PM   Specimen: BLOOD  Result Value Ref Range Status  Specimen Description BLOOD RIGHT ANTECUBITAL  Final   Special Requests   Final    BOTTLES DRAWN AEROBIC AND ANAEROBIC Blood Culture adequate volume   Culture   Final    NO GROWTH < 12 HOURS Performed at [facility], [address], [place], [place] [address]    Report Status PENDING  Incomplete  Resp Panel by RT-PCR (Flu A&B, Covid) Nasopharyngeal Swab     Status: None   Collection Time: 03/16/21  7:45 PM   Specimen: Nasopharyngeal Swab; Nasopharyngeal(NP) swabs in vial transport medium  Result Value Ref Range Status   SARS Coronavirus 2 by RT PCR NEGATIVE NEGATIVE Final    Comment: (NOTE) SARS-CoV-2 target nucleic acids are NOT DETECTED.  The SARS-CoV-2 RNA is generally detectable in upper respiratory specimens during the acute phase of infection. The lowest concentration of SARS-CoV-2 viral copies this assay can detect is 138 copies/mL. A negative result does not preclude SARS-Cov-2 infection and should not be used as the sole basis for treatment or other  patient management decisions. A negative result may occur with  improper specimen collection/handling, submission of specimen other than nasopharyngeal swab, presence of viral mutation(s) within the areas targeted by this assay, and inadequate number of viral copies(<138 copies/mL). A negative result must be combined with clinical observations, patient history, and epidemiological information. The expected result is Negative.  Fact Sheet for Patients:  EntrepreneurPulse.com.au  Fact Sheet for Healthcare Providers:  IncredibleEmployment.be  This test is no t yet approved or cleared by the [place] FDA and  has been authorized for detection and/or diagnosis of SARS-CoV-2 by FDA under an Emergency Use Authorization (EUA). This EUA will remain  in effect (meaning this test can be used) for the duration of the COVID-19 declaration under Section 564(b)(1) of the Act, 21 U.S.C.section 360bbb-3(b)(1), unless the authorization is terminated  or revoked sooner.       Influenza A by PCR NEGATIVE NEGATIVE Final   Influenza B by PCR NEGATIVE NEGATIVE Final    Comment: (NOTE) The Xpert Xpress SARS-CoV-2/FLU/RSV plus assay is intended as an aid in the diagnosis of influenza from Nasopharyngeal swab specimens and should not be used as a sole basis for treatment. Nasal washings and aspirates are unacceptable for Xpert Xpress SARS-CoV-2/FLU/RSV testing.  Fact Sheet for Patients: EntrepreneurPulse.com.au  Fact Sheet for Healthcare Providers: IncredibleEmployment.be  This test is not yet approved or cleared by the [place] FDA and has been authorized for detection and/or diagnosis of SARS-CoV-2 by FDA under an Emergency Use Authorization (EUA). This EUA will remain in effect (meaning this test can be used) for the duration of the COVID-19 declaration under Section 564(b)(1) of the Act, 21 U.S.C. section  360bbb-3(b)(1), unless the authorization is terminated or revoked.  Performed at [facility], [address] [place], [place] [address]          Radiology Studies: DG Chest 2 View  Result Date: 03/16/2021 CLINICAL DATA:  Urinary tract infection, altered level of consciousness, weakness EXAM: CHEST - 2 VIEW COMPARISON:  06/17/2020 FINDINGS: Frontal and lateral views of the chest demonstrate a stable cardiac silhouette. No acute airspace disease, effusion, or pneumothorax. Left convex scoliosis centered at the thoracolumbar junction. No acute bony abnormality. IMPRESSION: 1. No acute intrathoracic process. Electronically Signed   By: Randa Ngo M.D.   On: 03/16/2021 18:20   CT Abdomen Pelvis W Contrast  Result Date: 03/16/2021 CLINICAL DATA:  Abdominal pain EXAM: CT ABDOMEN AND PELVIS WITH CONTRAST TECHNIQUE: Multidetector  CT imaging of the abdomen and pelvis was performed using the standard protocol following bolus administration of intravenous contrast. RADIATION DOSE REDUCTION: This exam was performed according to the departmental dose-optimization program which includes automated exposure control, adjustment of the mA and/or kV according to patient size and/or use of iterative reconstruction technique. CONTRAST:  77mL OMNIPAQUE IOHEXOL 300 MG/ML  SOLN COMPARISON:  11/14/2018 FINDINGS: Lower chest: No acute abnormality. Hepatobiliary: Hepatic steatosis. No focal hepatic lesion. No intra or extrahepatic biliary ductal dilatation. The hepatic and portal veins are patent. The gallbladder is somewhat distended but otherwise unremarkable. Pancreas: Unremarkable. No pancreatic ductal dilatation or surrounding inflammatory changes. Spleen: Normal in size without focal abnormality. Adrenals/Urinary Tract: The adrenal glands are unremarkable. The kidneys enhance symmetrically with no hydronephrosis. The bladder is unremarkable for degree of distension. Stomach/Bowel: Stomach is within  normal limits. Appendix appears normal. No evidence of bowel wall thickening, distention, or inflammatory changes. Diverticulosis without evidence of diverticulitis. In the rectum, there is a large amount of stool, which measures up to 7.2 cm, without evidence of stercoral colitis. The largest single discrete stool ball measures up to 5.1 x 5.7 x 5.9 cm. Vascular/Lymphatic: Aortic atherosclerosis. No enlarged abdominal or pelvic lymph nodes. Reproductive: Status post hysterectomy. Other: No free fluid or free air in the abdomen or pelvis. Musculoskeletal: S shaped curvature of the thoracolumbar spine. Counting from the lowest rib-bearing vertebral body, T12, where there are diminutive ribs, there are four lumbar type vertebral bodies, with sacralization of L5. 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 Trace anterolisthesis of L4 on L5. IMPRESSION: 1. Rectal stool ball, which measures up to 5.7 cm in the axial plane, without evidence of stercoral colitis. 2. Transitional anatomy, with sacralization of L5. Please correlate with imaging if any spinal intervention is planned. Electronically Signed   By: Merilyn Baba M.D.   On: 03/16/2021 19:56        Scheduled Meds:  enoxaparin (LOVENOX) injection  40 mg Subcutaneous Q24H   pantoprazole  80 mg Oral QAC breakfast   polyethylene glycol  17 g Oral BID   verapamil  120 mg Oral QPC supper   Continuous Infusions:  cefTRIAXone (ROCEPHIN)  IV     cefTRIAXone (ROCEPHIN)  IV     dextrose 5 % with KCl 20 mEq / L       LOS: 0 days    Time spent: 35 minutes    Sharen Hones, MD [facility]   To contact the attending provider between 7A-7P or the covering provider during after hours 7P-7A, please log into the web site [email or web address] and access using universal [place] password for that web site. If you do not have the password, please call the hospital operator.  03/17/2021, 1:09 PM

## 2021-03-17 NOTE — ED Notes (Signed)
Provided OJ as requested. Mixed with miralax

## 2021-03-17 NOTE — ED Notes (Signed)
99% on 2L [place]. Titrated to RA.

## 2021-03-17 NOTE — ED Notes (Signed)
Lab at bedside.  Family at bedside.  Stretcher locked in lowest position. Call bell in reach.

## 2021-03-17 NOTE — ED Notes (Signed)
Pt resting in bed. Appears to be sleeping at this time. Chest is rising and falling symmetrically. No acute distress noted. Will continue to monitor.  Family remains at bedside.  °

## 2021-03-17 NOTE — ED Notes (Signed)
Pt placed on bedpan  Oriented to person only. Not following all directions.

## 2021-03-18 ENCOUNTER — Encounter: Payer: Self-pay | Admitting: Internal Medicine

## 2021-03-18 DIAGNOSIS — R6520 Severe sepsis without septic shock: Secondary | ICD-10-CM | POA: Diagnosis not present

## 2021-03-18 DIAGNOSIS — N12 Tubulo-interstitial nephritis, not specified as acute or chronic: Secondary | ICD-10-CM | POA: Diagnosis not present

## 2021-03-18 DIAGNOSIS — A401 Sepsis due to streptococcus, group B: Secondary | ICD-10-CM | POA: Diagnosis not present

## 2021-03-18 DIAGNOSIS — E440 Moderate protein-calorie malnutrition: Secondary | ICD-10-CM | POA: Insufficient documentation

## 2021-03-18 LAB — CBC WITH DIFFERENTIAL/PLATELET
Abs Immature Granulocytes: 0.04 10*3/uL (ref 0.00–0.07)
Basophils Absolute: 0.1 10*3/uL (ref 0.0–0.1)
Basophils Relative: 0 %
Eosinophils Absolute: 0.3 10*3/uL (ref 0.0–0.5)
Eosinophils Relative: 2 %
HCT: 37.6 % (ref 36.0–46.0)
Hemoglobin: 11.3 g/dL — ABNORMAL LOW (ref 12.0–15.0)
Immature Granulocytes: 0 %
Lymphocytes Relative: 15 %
Lymphs Abs: 1.9 10*3/uL (ref 0.7–4.0)
MCH: 27 pg (ref 26.0–34.0)
MCHC: 30.1 g/dL (ref 30.0–36.0)
MCV: 89.7 fL (ref 80.0–100.0)
Monocytes Absolute: 1 10*3/uL (ref 0.1–1.0)
Monocytes Relative: 8 %
Neutro Abs: 9.4 10*3/uL — ABNORMAL HIGH (ref 1.7–7.7)
Neutrophils Relative %: 75 %
Platelets: 208 10*3/uL (ref 150–400)
RBC: 4.19 MIL/uL (ref 3.87–5.11)
RDW: 13.3 % (ref 11.5–15.5)
WBC: 12.6 10*3/uL — ABNORMAL HIGH (ref 4.0–10.5)
nRBC: 0 % (ref 0.0–0.2)

## 2021-03-18 LAB — MAGNESIUM: Magnesium: 2.3 mg/dL (ref 1.7–2.4)

## 2021-03-18 LAB — BASIC METABOLIC PANEL
Anion gap: 8 (ref 5–15)
BUN: 36 mg/dL — ABNORMAL HIGH (ref 8–23)
CO2: 29 mmol/L (ref 22–32)
Calcium: 9.1 mg/dL (ref 8.9–10.3)
Chloride: 110 mmol/L (ref 98–111)
Creatinine, Ser: 0.69 mg/dL (ref 0.44–1.00)
GFR, Estimated: >60 mL/min (ref >60)
Glucose, Bld: 128 mg/dL — ABNORMAL HIGH (ref 70–99)
Potassium: 3.5 mmol/L (ref 3.5–5.1)
Sodium: 147 mmol/L — ABNORMAL HIGH (ref 135–145)

## 2021-03-18 MED ORDER — ALPRAZOLAM 0.5 MG PO TABS
0.5000 mg | ORAL_TABLET | Freq: Two times a day (BID) | ORAL | Status: DC
  Administered 2021-03-18 – 2021-03-20 (×4): 0.5 mg via ORAL
  Filled 2021-03-18 (×4): qty 1

## 2021-03-18 MED ORDER — SORBITOL 70 % SOLN
960.0000 mL | TOPICAL_OIL | Freq: Once | ORAL | Status: AC
  Administered 2021-03-18: 960 mL via RECTAL
  Filled 2021-03-18: qty 473

## 2021-03-18 MED ORDER — ADULT MULTIVITAMIN W/MINERALS CH
1.0000 | ORAL_TABLET | Freq: Every day | ORAL | Status: DC
  Administered 2021-03-19 – 2021-03-20 (×2): 1 via ORAL
  Filled 2021-03-18 (×2): qty 1

## 2021-03-18 MED ORDER — SODIUM CHLORIDE 0.9 % IV SOLN
2.0000 g | INTRAVENOUS | Status: DC
  Administered 2021-03-18 – 2021-03-19 (×2): 2 g via INTRAVENOUS
  Filled 2021-03-18: qty 2
  Filled 2021-03-18 (×2): qty 20

## 2021-03-18 MED ORDER — LACTULOSE 10 GM/15ML PO SOLN: 20.0000 g | Freq: Two times a day (BID) | ORAL | Status: DC | PRN

## 2021-03-18 MED ORDER — ENSURE ENLIVE PO LIQD
237.0000 mL | Freq: Three times a day (TID) | ORAL | Status: DC
  Administered 2021-03-18 – 2021-03-20 (×4): 237 mL via ORAL

## 2021-03-18 NOTE — Progress Notes (Signed)
SMOG enema given per [facility], patient unable to have successful BM. Digital disimpaction only removed very minimal stool removed. Patient tolerated fairly. Husband remains at bedside. Dr. Mal Misty made aware.

## 2021-03-18 NOTE — Progress Notes (Signed)
Initial Nutrition Assessment  DOCUMENTATION CODES:  Non-severe (moderate) malnutrition in context of social or environmental circumstances  INTERVENTION:  Add Ensure Plus High Protein po TID, each supplement provides 350 kcal and 20 grams of protein.   Add MVI with minerals daily.  Obtain updated weight.  Encourage PO and supplement intake.  NUTRITION DIAGNOSIS:  Moderate Malnutrition related to social / environmental circumstances (depression/anxiety) as evidenced by moderate fat depletion, moderate muscle depletion.  GOAL:  Patient will meet greater than or equal to 90% of their needs  MONITOR:  PO intake, Supplement acceptance, Labs, Weight trends, I & O's  REASON FOR ASSESSMENT:  Malnutrition Screening Tool    ASSESSMENT:  71 yo female with a PMH of depression, anxiety, hypertension, GERD, and recent diagnosis of UTI in the outpatient setting (urine culture showed group B strep and on 03/12/2021) who presented to the hospital because of lower abdominal pain, increased frequency of micturition, and general ill feeling. She was admitted to the hospital for severe sepsis 2/2 acute pyelonephritis.  Per Epic, pt ate 60% of dinner last night and 0% of breakfast this morning.  Spoke with son at bedside as pt was a bit confused at time of RD visit. She was ready to take a nap.  Son reports that he care for her and knows what she enjoys eating, so he orders her meals and takes care of her at home. He reports that pt will drink Ensure sometimes. He does that at home when she does not eat well.  Son also reports that her appetite has decreased over the past 2-3 weeks since a surgery she had. Prior to this, her appetite was good.  Pt reports that her weight is okay. Son reports her weight has decreased.   Per Epic, pt's weight has decreased, but current weight appears stated. RD to order measured weight to determine change.  Of note, pt with mild BLE edema.  RD to order Ensure TID  to promote intake.  Medications: reviewed; Xanax BID, lactulose once, Protonix, Senokot BID, SMOG enema once, Rocephin per IV  Labs: reviewed; Na 147 (H), Glucose 128 (H)  NUTRITION - FOCUSED PHYSICAL EXAM: Flowsheet Row Most Recent Value  Orbital Region Moderate depletion  Upper Arm Region Moderate depletion  Thoracic and Lumbar Region Moderate depletion  Buccal Region Severe depletion  Temple Region Severe depletion  Clavicle Bone Region Moderate depletion  Clavicle and Acromion Bone Region Moderate depletion  Scapular Bone Region Unable to assess  Dorsal Hand Moderate depletion  Patellar Region Moderate depletion  Anterior Thigh Region Moderate depletion  Posterior Calf Region Severe depletion  Edema (RD Assessment) Mild  Hair Reviewed  Eyes Reviewed  Mouth Reviewed  Skin Reviewed  Nails Reviewed   Diet Order:   Diet Order             Diet Heart Room service appropriate? Yes; Fluid consistency: Thin  Diet effective now                  EDUCATION NEEDS:  Education needs have been addressed  Skin:  Skin Assessment: Reviewed RN Assessment (Ecchymosis)  Last BM:  03/18/21 - Type 2, small  Height:  Ht Readings from Last 1 Encounters:  03/16/21 5\' 2"  (1.575 m)   Weight:  Wt Readings from Last 1 Encounters:  03/16/21 49.9 kg   BMI:  Body mass index is 20.12 kg/m.  Estimated Nutritional Needs:  Kcal:  1800-2000 Protein:  75-90 grams Fluid:  >1.8 L  Jinny Blossom  Alucard Fearnow, RD, LDN (she/her/hers) Clinical Inpatient Dietitian RD Pager/After-Hours/Weekend Pager # in [place]

## 2021-03-18 NOTE — Progress Notes (Signed)
Progress Note    Janet Phillips  GGY:[id number] DOB: 03-May-1950  DOA: 03/16/2021 PCP: Adin Hector, MD      Brief Narrative:    Medical records reviewed and are as summarized below:  Janet Phillips is a 71 y.o. female with medical history significant for depression, anxiety, hypertension, GERD, recent diagnosis of UTI in the outpatient setting (urine culture showed group B strep and on 03/12/2021), who presented to the hospital because of lower abdominal pain, increased frequency of micturition and general ill feeling.  She was hypoxic with oxygen saturation of 86% on room air.  She was admitted to the hospital for severe sepsis secondary to acute pyelonephritis.    Assessment/Plan:   Principal Problem:   Pyelonephritis Active Problems:   Anxiety   Acid reflux   HLD (hyperlipidemia)   Leukocytosis   Constipation   Essential hypertension   AKI (acute kidney injury) ([place])   Severe sepsis with acute organ dysfunction due to group B Streptococcus (HCC)   Acute pyelonephritis   Hypernatremia   Hypokalemia   Body mass index is 20.12 kg/m.  Severe sepsis secondary to acute pyelonephritis, leukocytosis: Continue IV ceftriaxone.  Follow-up urine and blood cultures.  Previous urine culture from the outpatient setting showed group B strep.  AKI, hypernatremia: Improving.  Encourage adequate oral intake.  Hypokalemia: Improved.  Constipation: Continue laxatives.  Smog enema has been added.  Acute hypoxemic respiratory failure: Improved  Generalized weakness: Consult PT  Other comorbidities include hypertension, anxiety. Jody, son, was at the bedside and said that patient takes scheduled  Xanax twice a day   Diet Order             Diet Heart Room service appropriate? Yes; Fluid consistency: Thin  Diet effective now                      Consultants: None  Procedures: None    Medications:    ALPRAZolam  0.5 mg Oral BID   enoxaparin  (LOVENOX) injection  40 mg Subcutaneous Q24H   lactulose  20 g Oral Once   pantoprazole  80 mg Oral QAC breakfast   senna-docusate  2 tablet Oral BID   sorbitol, milk of mag, mineral oil, glycerin (SMOG) enema  960 mL Rectal Once   verapamil  120 mg Oral QPC supper   Continuous Infusions:  cefTRIAXone (ROCEPHIN)  IV 2 g (03/18/21 1102)     Anti-infectives (From admission, onward)    Start     Dose/Rate Route Frequency Ordered Stop   03/18/21 1100  cefTRIAXone (ROCEPHIN) 2 g in sodium chloride 0.9 % 100 mL IVPB       Note to Pharmacy: She needs ceftriaxone 2 g total. Thank you.   2 g 200 mL/hr over 30 Minutes Intravenous Every 24 hours 03/18/21 0945     03/17/21 2000  cefTRIAXone (ROCEPHIN) 2 g in sodium chloride 0.9 % 100 mL IVPB       Note to Pharmacy: She needs ceftriaxone 2 g total. Thank you.   2 g 200 mL/hr over 30 Minutes Intravenous Every 24 hours 03/16/21 2051 03/18/21 0007   03/16/21 2100  cefTRIAXone (ROCEPHIN) 1 g in sodium chloride 0.9 % 100 mL IVPB  Status:  Discontinued       Note to Pharmacy: Give 2nd dose of Rocephin 1gm to total 2gm dose   1 g 200 mL/hr over 30 Minutes Intravenous  Once 03/16/21 2046 03/18/21 0957  03/16/21 2015  cefTRIAXone (ROCEPHIN) 1 g in sodium chloride 0.9 % 100 mL IVPB  Status:  Discontinued       Note to Pharmacy: She needs ceftriaxone 2 g total. Thank you.   1 g 200 mL/hr over 30 Minutes Intravenous Every 24 hours 03/16/21 2013 03/16/21 2047   03/16/21 1945  cefTRIAXone (ROCEPHIN) 1 g in sodium chloride 0.9 % 100 mL IVPB        1 g 200 mL/hr over 30 Minutes Intravenous  Once 03/16/21 1939 03/16/21 2035   03/16/21 1830  vancomycin (VANCOCIN) IVPB 1000 mg/200 mL premix        1,000 mg 200 mL/hr over 60 Minutes Intravenous  Once 03/16/21 1825 03/16/21 2155   03/16/21 1815  meropenem (MERREM) 1 g in sodium chloride 0.9 % 100 mL IVPB  Status:  Discontinued        1 g 200 mL/hr over 30 Minutes Intravenous  Once 03/16/21 1805 03/16/21 1828               Family Communication/Anticipated D/C date and plan/Code Status   DVT prophylaxis: enoxaparin (LOVENOX) injection 40 mg Start: 03/16/21 2200 Place TED hose Start: 03/16/21 2013     Code Status: Full Code  Family Communication: Plan discussed with Jody, son, at the bedside Disposition Plan: Plan to discharge home in 1 to 2 days   Status is: Inpatient Remains inpatient appropriate because: IV antibiotics  Planned Discharge Destination: Home with Home Health             Subjective:   C/o left mid back pain.  Objective:    Vitals:   03/17/21 1713 03/17/21 1940 03/18/21 0333 03/18/21 0814  BP: (!) 144/65 (!) 141/59 (!) 135/59 (!) 156/63  Pulse: 82 87 84 81  Resp:  16 18 18   Temp:  97.9 F (36.6 C) 98 F (36.7 C) (!) 97.4 F (36.3 C)  TempSrc:   Oral Oral  SpO2: 98% 96% 96% 95%  Weight:      Height:       No data found.   Intake/Output Summary (Last 24 hours) at 03/18/2021 1331 Last data filed at 03/18/2021 0959 Gross per 24 hour  Intake 1055.35 ml  Output 200 ml  Net 855.35 ml   Filed Weights   03/16/21 1744  Weight: 49.9 kg    Exam:  GEN: NAD SKIN: No rash EYES: EOMI ENT: MMM CV: RRR PULM: CTA B ABD: soft, ND, NT, +BS CNS: AAO x 3, non focal EXT: No edema or tenderness GU: Left CVA tenderness       Data Reviewed:   I have personally reviewed following labs and imaging studies:  Labs: Labs show the following:   Basic Metabolic Panel: Recent Labs  Lab 03/16/21 1758 03/17/21 0736 03/18/21 0356  NA 155* 153* 147*  K 3.4* 3.1* 3.5  CL 115* 116* 110  CO2 28 29 29   GLUCOSE 151* 114* 128*  BUN 73* 50* 36*  CREATININE 1.27* 0.87 0.69  CALCIUM 10.4* 9.3 9.1  MG  --  2.7* 2.3  PHOS  --  2.4*  --    GFR Estimated Creatinine Clearance: 50.8 mL/min (by C-G formula based on SCr of 0.69 mg/dL). Liver Function Tests: Recent Labs  Lab 03/16/21 1758  AST 28  ALT 28  ALKPHOS 124  BILITOT 0.8  PROT 7.8   ALBUMIN 4.3   Recent Labs  Lab 03/16/21 1758  LIPASE 32   No results for input(s): AMMONIA in  the last 168 hours. Coagulation profile Recent Labs  Lab 03/16/21 1758 03/17/21 0736  INR 1.2 1.2    CBC: Recent Labs  Lab 03/16/21 1758 03/17/21 0736 03/18/21 0356  WBC 19.1* 12.9* 12.6*  NEUTROABS 15.4* 9.7* 9.4*  HGB 14.3 12.0 11.3*  HCT 47.9* 39.0 37.6  MCV 90.4 86.7 89.7  PLT 402* 247 208   Cardiac Enzymes: No results for input(s): CKTOTAL, CKMB, CKMBINDEX, TROPONINI in the last 168 hours. BNP (last 3 results) No results for input(s): PROBNP in the last 8760 hours. CBG: No results for input(s): GLUCAP in the last 168 hours. D-Dimer: No results for input(s): DDIMER in the last 72 hours. Hgb A1c: No results for input(s): HGBA1C in the last 72 hours. Lipid Profile: No results for input(s): CHOL, HDL, LDLCALC, TRIG, CHOLHDL, LDLDIRECT in the last 72 hours. Thyroid function studies: No results for input(s): TSH, T4TOTAL, T3FREE, THYROIDAB in the last 72 hours.  Invalid input(s): FREET3 Anemia work up: No results for input(s): VITAMINB12, FOLATE, FERRITIN, TIBC, IRON, RETICCTPCT in the last 72 hours. Sepsis Labs: Recent Labs  Lab 03/16/21 1758 03/16/21 1945 03/17/21 0736 03/18/21 0356  PROCALCITON  --   --  <0.10  --   WBC 19.1*  --  12.9* 12.6*  LATICACIDVEN 1.9 1.6  --   --     Microbiology Recent Results (from the past 240 hour(s))  Urine Culture     Status: Abnormal   Collection Time: 03/12/21  2:40 AM   Specimen: Urine   UC  Result Value Ref Range Status   Urine Culture, Routine Final report (A)  Final   Organism ID, Bacteria Comment (A)  Final    Comment: Beta hemolytic Streptococcus, group B Penicillin and ampicillin are drugs of choice for treatment of beta-hemolytic streptococcal infections. Susceptibility testing of penicillins and other beta-lactam agents approved by the FDA for treatment of beta-hemolytic streptococcal infections need not  be performed routinely because nonsusceptible isolates are extremely rare in any beta-hemolytic streptococcus and have not been reported for Streptococcus pyogenes (group A). (CLSI) 50,000-100,000 colony forming units per mL    ORGANISM ID, BACTERIA Comment  Final    Comment: Mixed urogenital flora Less than 10,000 colonies/mL   Blood culture (routine x 2)     Status: None (Preliminary result)   Collection Time: 03/16/21  5:58 PM   Specimen: BLOOD  Result Value Ref Range Status   Specimen Description BLOOD LEFT ANTECUBITAL  Final   Special Requests   Final    BOTTLES DRAWN AEROBIC AND ANAEROBIC Blood Culture adequate volume   Culture   Final    NO GROWTH 2 DAYS Performed at [facility], [address], [place], [place] [address]    Report Status PENDING  Incomplete  Culture, blood (single) w Reflex to ID Panel     Status: None (Preliminary result)   Collection Time: 03/16/21  7:44 PM   Specimen: BLOOD  Result Value Ref Range Status   Specimen Description BLOOD RIGHT ANTECUBITAL  Final   Special Requests   Final    BOTTLES DRAWN AEROBIC AND ANAEROBIC Blood Culture adequate volume   Culture   Final    NO GROWTH 2 DAYS Performed at [facility], [address] [place], [place] [address]    Report Status PENDING  Incomplete  Resp Panel by RT-PCR (Flu A&B, Covid) Nasopharyngeal Swab     Status: None   Collection Time: 03/16/21  7:45 PM   Specimen: Nasopharyngeal Swab; Nasopharyngeal(NP) swabs  in vial transport medium  Result Value Ref Range Status   SARS Coronavirus 2 by RT PCR NEGATIVE NEGATIVE Final    Comment: (NOTE) SARS-CoV-2 target nucleic acids are NOT DETECTED.  The SARS-CoV-2 RNA is generally detectable in upper respiratory specimens during the acute phase of infection. The lowest concentration of SARS-CoV-2 viral copies this assay can detect is 138 copies/mL. A negative result does not preclude SARS-Cov-2 infection and should not be used as the  sole basis for treatment or other patient management decisions. A negative result may occur with  improper specimen collection/handling, submission of specimen other than nasopharyngeal swab, presence of viral mutation(s) within the areas targeted by this assay, and inadequate number of viral copies(<138 copies/mL). A negative result must be combined with clinical observations, patient history, and epidemiological information. The expected result is Negative.  Fact Sheet for Patients:  EntrepreneurPulse.com.au  Fact Sheet for Healthcare Providers:  IncredibleEmployment.be  This test is no t yet approved or cleared by the [place] FDA and  has been authorized for detection and/or diagnosis of SARS-CoV-2 by FDA under an Emergency Use Authorization (EUA). This EUA will remain  in effect (meaning this test can be used) for the duration of the COVID-19 declaration under Section 564(b)(1) of the Act, 21 U.S.C.section 360bbb-3(b)(1), unless the authorization is terminated  or revoked sooner.       Influenza A by PCR NEGATIVE NEGATIVE Final   Influenza B by PCR NEGATIVE NEGATIVE Final    Comment: (NOTE) The Xpert Xpress SARS-CoV-2/FLU/RSV plus assay is intended as an aid in the diagnosis of influenza from Nasopharyngeal swab specimens and should not be used as a sole basis for treatment. Nasal washings and aspirates are unacceptable for Xpert Xpress SARS-CoV-2/FLU/RSV testing.  Fact Sheet for Patients: EntrepreneurPulse.com.au  Fact Sheet for Healthcare Providers: IncredibleEmployment.be  This test is not yet approved or cleared by the [place] FDA and has been authorized for detection and/or diagnosis of SARS-CoV-2 by FDA under an Emergency Use Authorization (EUA). This EUA will remain in effect (meaning this test can be used) for the duration of the COVID-19 declaration under Section 564(b)(1) of the  Act, 21 U.S.C. section 360bbb-3(b)(1), unless the authorization is terminated or revoked.  Performed at [facility], [address] [place],  [address]     Procedures and diagnostic studies:  DG Chest 2 View  Result Date: 03/16/2021 CLINICAL DATA:  Urinary tract infection, altered level of consciousness, weakness EXAM: CHEST - 2 VIEW COMPARISON:  06/17/2020 FINDINGS: Frontal and lateral views of the chest demonstrate a stable cardiac silhouette. No acute airspace disease, effusion, or pneumothorax. Left convex scoliosis centered at the thoracolumbar junction. No acute bony abnormality. IMPRESSION: 1. No acute intrathoracic process. Electronically Signed   By: Randa Ngo M.D.   On: 03/16/2021 18:20   CT Abdomen Pelvis W Contrast  Result Date: 03/16/2021 CLINICAL DATA:  Abdominal pain EXAM: CT ABDOMEN AND PELVIS WITH CONTRAST TECHNIQUE: Multidetector CT imaging of the abdomen and pelvis was performed using the standard protocol following bolus administration of intravenous contrast. RADIATION DOSE REDUCTION: This exam was performed according to the departmental dose-optimization program which includes automated exposure control, adjustment of the mA and/or kV according to patient size and/or use of iterative reconstruction technique. CONTRAST:  47mL OMNIPAQUE IOHEXOL 300 MG/ML  SOLN COMPARISON:  11/14/2018 FINDINGS: Lower chest: No acute abnormality. Hepatobiliary: Hepatic steatosis. No focal hepatic lesion. No intra or extrahepatic biliary ductal dilatation. The hepatic and portal veins are patent. The  gallbladder is somewhat distended but otherwise unremarkable. Pancreas: Unremarkable. No pancreatic ductal dilatation or surrounding inflammatory changes. Spleen: Normal in size without focal abnormality. Adrenals/Urinary Tract: The adrenal glands are unremarkable. The kidneys enhance symmetrically with no hydronephrosis. The bladder is unremarkable for degree of distension.  Stomach/Bowel: Stomach is within normal limits. Appendix appears normal. No evidence of bowel wall thickening, distention, or inflammatory changes. Diverticulosis without evidence of diverticulitis. In the rectum, there is a large amount of stool, which measures up to 7.2 cm, without evidence of stercoral colitis. The largest single discrete stool ball measures up to 5.1 x 5.7 x 5.9 cm. Vascular/Lymphatic: Aortic atherosclerosis. No enlarged abdominal or pelvic lymph nodes. Reproductive: Status post hysterectomy. Other: No free fluid or free air in the abdomen or pelvis. Musculoskeletal: S shaped curvature of the thoracolumbar spine. Counting from the lowest rib-bearing vertebral body, T12, where there are diminutive ribs, there are four lumbar type vertebral bodies, with sacralization of L5. 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 Trace anterolisthesis of L4 on L5. IMPRESSION: 1. Rectal stool ball, which measures up to 5.7 cm in the axial plane, without evidence of stercoral colitis. 2. Transitional anatomy, with sacralization of L5. Please correlate with imaging if any spinal intervention is planned. Electronically Signed   By: Merilyn Baba M.D.   On: 03/16/2021 19:56               LOS: 1 day   Delphina Schum  [facility]   Pager on [email or web address]. If 7PM-7AM, please contact night-coverage at [email or web address]     03/18/2021, 1:31 PM

## 2021-03-19 DIAGNOSIS — N12 Tubulo-interstitial nephritis, not specified as acute or chronic: Secondary | ICD-10-CM | POA: Diagnosis not present

## 2021-03-19 DIAGNOSIS — E870 Hyperosmolality and hypernatremia: Secondary | ICD-10-CM | POA: Diagnosis not present

## 2021-03-19 DIAGNOSIS — N179 Acute kidney failure, unspecified: Secondary | ICD-10-CM | POA: Diagnosis not present

## 2021-03-19 LAB — CBC WITH DIFFERENTIAL/PLATELET
Abs Immature Granulocytes: 0.05 10*3/uL (ref 0.00–0.07)
Basophils Absolute: 0.1 10*3/uL (ref 0.0–0.1)
Basophils Relative: 0 %
Eosinophils Absolute: 0.4 10*3/uL (ref 0.0–0.5)
Eosinophils Relative: 4 %
HCT: 36 % (ref 36.0–46.0)
Hemoglobin: 11 g/dL — ABNORMAL LOW (ref 12.0–15.0)
Immature Granulocytes: 0 %
Lymphocytes Relative: 17 %
Lymphs Abs: 1.9 10*3/uL (ref 0.7–4.0)
MCH: 26.8 pg (ref 26.0–34.0)
MCHC: 30.6 g/dL (ref 30.0–36.0)
MCV: 87.6 fL (ref 80.0–100.0)
Monocytes Absolute: 1 10*3/uL (ref 0.1–1.0)
Monocytes Relative: 9 %
Neutro Abs: 7.9 10*3/uL — ABNORMAL HIGH (ref 1.7–7.7)
Neutrophils Relative %: 70 %
Platelets: 209 10*3/uL (ref 150–400)
RBC: 4.11 MIL/uL (ref 3.87–5.11)
RDW: 13.2 % (ref 11.5–15.5)
WBC: 11.3 10*3/uL — ABNORMAL HIGH (ref 4.0–10.5)
nRBC: 0 % (ref 0.0–0.2)

## 2021-03-19 LAB — BASIC METABOLIC PANEL
Anion gap: 4 — ABNORMAL LOW (ref 5–15)
BUN: 35 mg/dL — ABNORMAL HIGH (ref 8–23)
CO2: 29 mmol/L (ref 22–32)
Calcium: 8.7 mg/dL — ABNORMAL LOW (ref 8.9–10.3)
Chloride: 113 mmol/L — ABNORMAL HIGH (ref 98–111)
Creatinine, Ser: 0.62 mg/dL (ref 0.44–1.00)
GFR, Estimated: >60 mL/min (ref >60)
Glucose, Bld: 118 mg/dL — ABNORMAL HIGH (ref 70–99)
Potassium: 3.6 mmol/L (ref 3.5–5.1)
Sodium: 146 mmol/L — ABNORMAL HIGH (ref 135–145)

## 2021-03-19 LAB — URINE CULTURE: Culture: NO GROWTH

## 2021-03-19 LAB — PHOSPHORUS: Phosphorus: 2.2 mg/dL — ABNORMAL LOW (ref 2.5–4.6)

## 2021-03-19 MED ORDER — K PHOS MONO-SOD PHOS DI & MONO 155-852-130 MG PO TABS
250.0000 mg | ORAL_TABLET | Freq: Three times a day (TID) | ORAL | Status: AC
  Administered 2021-03-19 (×3): 250 mg via ORAL
  Filled 2021-03-19 (×3): qty 1

## 2021-03-19 NOTE — TOC Initial Note (Signed)
Transition of Care [facility]) - Initial/Assessment Note    Patient Details  Name: Janet Phillips MRN: [id number] Date of Birth: May 15, 1950  Transition of Care [facility]) CM/SW Contact:    Beverly Sessions, RN Phone Number: 03/19/2021, 3:21 PM  Clinical Narrative:                 Assessment completed via phone with husband  Admitted HUD:JSHFWY Admitted from:home with husband OVZ:CHYIF , husband transports Current home health/prior home health/DME: [facility]  Therapy recommending home health.  Husband in agreement.  States he does not have a preference of home health agency. Referral made to [facility] with [facility]   Expected Discharge Plan: [place] Barriers to Discharge: Continued Medical Work up   Patient Goals and CMS Choice   CMS Medicare.gov Compare Post Acute Care list provided to:: Patient Represenative (must comment) Choice offered to / list presented to : Spouse  Expected Discharge Plan and Services Expected Discharge Plan: [place] Acute Care Choice: [place] arrangements for the past 2 months: Single Family Home                           HH Arranged: PT, OT HH Agency: [place] Date [facility] Agency Contacted: 03/19/21   Representative spoke with at [place]: Malachy Mood  Prior Living Arrangements/Services Living arrangements for the past 2 months: [place] Lives with:: Spouse Patient language and need for interpreter reviewed:: Yes Do you feel safe going back to the place where you live?: Yes      Need for Family Participation in Patient Care: Yes (Comment) Care giver support system in place?: Yes (comment) Current home services: DME Criminal Activity/Legal Involvement Pertinent to Current Situation/Hospitalization: No - Comment as needed  Activities of Daily Living Home Assistive Devices/Equipment: Bedside commode/3-in-1, Blood pressure cuff ADL Screening (condition at time of  admission) Patient's cognitive ability adequate to safely complete daily activities?: Yes Is the patient deaf or have difficulty hearing?: No Does the patient have difficulty seeing, even when wearing glasses/contacts?: No Does the patient have difficulty concentrating, remembering, or making decisions?: Yes Patient able to express need for assistance with ADLs?: Yes Does the patient have difficulty dressing or bathing?: No Independently performs ADLs?: Yes (appropriate for developmental age) Does the patient have difficulty walking or climbing stairs?: Yes Weakness of Legs: Both Weakness of Arms/Hands: None  Permission Sought/Granted                  Emotional Assessment       Orientation: : Oriented to Self, Oriented to Place Alcohol / Substance Use: Not Applicable Psych Involvement: No (comment)  Admission diagnosis:  Pyelonephritis [N12] Acute pyelonephritis [N10] Sepsis with acute renal failure and tubular necrosis without septic shock, due to unspecified organism ([place]) [A41.9, R65.20, N17.0] Patient Active Problem List   Diagnosis Date Noted   Malnutrition of moderate degree 03/18/2021   Acute pyelonephritis 03/17/2021   Hypernatremia 03/17/2021   Hypokalemia 03/17/2021   Pyelonephritis 03/16/2021   Leukocytosis 03/16/2021   Constipation 03/16/2021   Essential hypertension 03/16/2021   AKI (acute kidney injury) ([employer]) 03/16/2021   Severe sepsis with acute organ dysfunction due to group B Streptococcus ([place]) 03/16/2021   Vaginal discharge 08/29/2017   Swelling of limb 12/23/2015   Lymphedema 12/23/2015   Iron deficiency anemia 09/24/2015   Allergic rhinitis 01/21/2015   Absolute anemia 01/21/2015  Anxiety 01/21/2015   Arthritis 01/21/2015   Deep vein thrombosis (DVT) ([place]) 01/21/2015   Acid reflux 01/21/2015   Personal history of other specified conditions 01/21/2015   H/O acute myocardial infarction 01/21/2015   HLD (hyperlipidemia) 01/21/2015   BP (high  blood pressure) 01/21/2015   Adaptive colitis 01/21/2015   Disease of thyroid gland 01/21/2015   Avitaminosis D 01/21/2015   Cannot sleep 01/21/2015   Osteopenia 11/21/2014   Breast mass, left 10/02/2014   Infection of the upper respiratory tract 02/13/2014   Edema leg 09/15/2005   PCP:  Adin Hector, MD Pharmacy:   [facility] [id number], [place] [address] Phone: [phone] Fax: [phone]     Social Determinants of Health (SDOH) Interventions    Readmission Risk Interventions No flowsheet data found.

## 2021-03-19 NOTE — Progress Notes (Signed)
Progress Note    Janet Phillips  BJY:[id number] DOB: Jul 16, 1950  DOA: 03/16/2021 PCP: Adin Hector, MD      Brief Narrative:    Medical records reviewed and are as summarized below:  Janet Phillips is a 71 y.o. female with medical history significant for depression, anxiety, hypertension, GERD, recent diagnosis of UTI in the outpatient setting (urine culture showed group B strep and on 03/12/2021), who presented to the hospital because of lower abdominal pain, increased frequency of micturition and general ill feeling.  She was hypoxic with oxygen saturation of 86% on room air.  She was admitted to the hospital for severe sepsis secondary to acute pyelonephritis.    Assessment/Plan:   Principal Problem:   Pyelonephritis Active Problems:   Anxiety   Acid reflux   HLD (hyperlipidemia)   Leukocytosis   Constipation   Essential hypertension   AKI (acute kidney injury) ([place])   Severe sepsis with acute organ dysfunction due to group B Streptococcus (HCC)   Acute pyelonephritis   Hypernatremia   Hypokalemia   Malnutrition of moderate degree   Body mass index is 20.34 kg/m.  Severe sepsis secondary to acute pyelonephritis, leukocytosis: No growth on urine culture probably because she had been on antibiotics prior to obtaining urine culture.  No growth on blood cultures thus far.  Continue IV ceftriaxone for 1 more day. Previous urine culture from the outpatient setting showed group B strep.  AKI, hypernatremia: Improving.  Encouraged adequate oral intake.  Hypokalemia: Improved  Constipation: Improved.  Use laxatives as needed.  Acute hypoxemic respiratory failure: Improved  Generalized weakness: PT recommended home health therapy  Other comorbidities include hypertension, anxiety.    Diet Order             Diet Heart Room service appropriate? Yes; Fluid consistency: Thin  Diet effective now                       Consultants: None  Procedures: None    Medications:    ALPRAZolam  0.5 mg Oral BID   enoxaparin (LOVENOX) injection  40 mg Subcutaneous Q24H   feeding supplement  237 mL Oral TID BM   lactulose  20 g Oral Once   multivitamin with minerals  1 tablet Oral Daily   pantoprazole  80 mg Oral QAC breakfast   phosphorus  250 mg Oral TID   senna-docusate  2 tablet Oral BID   verapamil  120 mg Oral QPC supper   Continuous Infusions:  cefTRIAXone (ROCEPHIN)  IV 2 g (03/19/21 1144)     Anti-infectives (From admission, onward)    Start     Dose/Rate Route Frequency Ordered Stop   03/18/21 1100  cefTRIAXone (ROCEPHIN) 2 g in sodium chloride 0.9 % 100 mL IVPB       Note to Pharmacy: She needs ceftriaxone 2 g total. Thank you.   2 g 200 mL/hr over 30 Minutes Intravenous Every 24 hours 03/18/21 0945     03/17/21 2000  cefTRIAXone (ROCEPHIN) 2 g in sodium chloride 0.9 % 100 mL IVPB       Note to Pharmacy: She needs ceftriaxone 2 g total. Thank you.   2 g 200 mL/hr over 30 Minutes Intravenous Every 24 hours 03/16/21 2051 03/18/21 0007   03/16/21 2100  cefTRIAXone (ROCEPHIN) 1 g in sodium chloride 0.9 % 100 mL IVPB  Status:  Discontinued       Note to Pharmacy:  Give 2nd dose of Rocephin 1gm to total 2gm dose   1 g 200 mL/hr over 30 Minutes Intravenous  Once 03/16/21 2046 03/18/21 0957   03/16/21 2015  cefTRIAXone (ROCEPHIN) 1 g in sodium chloride 0.9 % 100 mL IVPB  Status:  Discontinued       Note to Pharmacy: She needs ceftriaxone 2 g total. Thank you.   1 g 200 mL/hr over 30 Minutes Intravenous Every 24 hours 03/16/21 2013 03/16/21 2047   03/16/21 1945  cefTRIAXone (ROCEPHIN) 1 g in sodium chloride 0.9 % 100 mL IVPB        1 g 200 mL/hr over 30 Minutes Intravenous  Once 03/16/21 1939 03/16/21 2035   03/16/21 1830  vancomycin (VANCOCIN) IVPB 1000 mg/200 mL premix        1,000 mg 200 mL/hr over 60 Minutes Intravenous  Once 03/16/21 1825 03/16/21 2155   03/16/21 1815   meropenem (MERREM) 1 g in sodium chloride 0.9 % 100 mL IVPB  Status:  Discontinued        1 g 200 mL/hr over 30 Minutes Intravenous  Once 03/16/21 1805 03/16/21 1828              Family Communication/Anticipated D/C date and plan/Code Status   DVT prophylaxis: enoxaparin (LOVENOX) injection 40 mg Start: 03/16/21 2200 Place TED hose Start: 03/16/21 2013     Code Status: Full Code  Family Communication: Plan discussed with Jody, son, at the bedside Disposition Plan: Plan to discharge home tomorrow   Status is: Inpatient Remains inpatient appropriate because: IV antibiotics  Planned Discharge Destination: Home with Home Health             Subjective:   Interval events noted.  She still has some pain in the left mid back.  She worked with PT today.  Jeral Fruit, son, was at the bedside  Objective:    Vitals:   03/18/21 1948 03/19/21 0429 03/19/21 0500 03/19/21 0806  BP: 139/68 132/71  (!) 148/66  Pulse: 93 90  87  Resp: 18 18  17   Temp: 98.4 F (36.9 C) 98.2 F (36.8 C)  97.6 F (36.4 C)  TempSrc: Oral Oral    SpO2: 96% 95%  95%  Weight:   50.4 kg   Height:       No data found.   Intake/Output Summary (Last 24 hours) at 03/19/2021 1400 Last data filed at 03/18/2021 8850 Gross per 24 hour  Intake 220 ml  Output --  Net 220 ml   Filed Weights   03/16/21 1744 03/19/21 0500  Weight: 49.9 kg 50.4 kg    Exam:  GEN: NAD SKIN: No rash EYES: EOMI ENT: MMM CV: RRR PULM: CTA B ABD: soft, ND, NT, +BS CNS: AAO x 2 (person and place), non focal EXT: No edema or tenderness GU: Mild left CVA tenderness        Data Reviewed:   I have personally reviewed following labs and imaging studies:  Labs: Labs show the following:   Basic Metabolic Panel: Recent Labs  Lab 03/16/21 1758 03/17/21 0736 03/18/21 0356 03/19/21 0407  NA 155* 153* 147* 146*  K 3.4* 3.1* 3.5 3.6  CL 115* 116* 110 113*  CO2 28 29 29 29   GLUCOSE 151* 114* 128* 118*  BUN  73* 50* 36* 35*  CREATININE 1.27* 0.87 0.69 0.62  CALCIUM 10.4* 9.3 9.1 8.7*  MG  --  2.7* 2.3  --   PHOS  --  2.4*  --  2.2*   GFR Estimated Creatinine Clearance: 51 mL/min (by C-G formula based on SCr of 0.62 mg/dL). Liver Function Tests: Recent Labs  Lab 03/16/21 1758  AST 28  ALT 28  ALKPHOS 124  BILITOT 0.8  PROT 7.8  ALBUMIN 4.3   Recent Labs  Lab 03/16/21 1758  LIPASE 32   No results for input(s): AMMONIA in the last 168 hours. Coagulation profile Recent Labs  Lab 03/16/21 1758 03/17/21 0736  INR 1.2 1.2    CBC: Recent Labs  Lab 03/16/21 1758 03/17/21 0736 03/18/21 0356 03/19/21 0407  WBC 19.1* 12.9* 12.6* 11.3*  NEUTROABS 15.4* 9.7* 9.4* 7.9*  HGB 14.3 12.0 11.3* 11.0*  HCT 47.9* 39.0 37.6 36.0  MCV 90.4 86.7 89.7 87.6  PLT 402* 247 208 209   Cardiac Enzymes: No results for input(s): CKTOTAL, CKMB, CKMBINDEX, TROPONINI in the last 168 hours. BNP (last 3 results) No results for input(s): PROBNP in the last 8760 hours. CBG: No results for input(s): GLUCAP in the last 168 hours. D-Dimer: No results for input(s): DDIMER in the last 72 hours. Hgb A1c: No results for input(s): HGBA1C in the last 72 hours. Lipid Profile: No results for input(s): CHOL, HDL, LDLCALC, TRIG, CHOLHDL, LDLDIRECT in the last 72 hours. Thyroid function studies: No results for input(s): TSH, T4TOTAL, T3FREE, THYROIDAB in the last 72 hours.  Invalid input(s): FREET3 Anemia work up: No results for input(s): VITAMINB12, FOLATE, FERRITIN, TIBC, IRON, RETICCTPCT in the last 72 hours. Sepsis Labs: Recent Labs  Lab 03/16/21 1758 03/16/21 1945 03/17/21 0736 03/18/21 0356 03/19/21 0407  PROCALCITON  --   --  <0.10  --   --   WBC 19.1*  --  12.9* 12.6* 11.3*  LATICACIDVEN 1.9 1.6  --   --   --     Microbiology Recent Results (from the past 240 hour(s))  Urine Culture     Status: Abnormal   Collection Time: 03/12/21  2:40 AM   Specimen: Urine   UC  Result Value Ref  Range Status   Urine Culture, Routine Final report (A)  Final   Organism ID, Bacteria Comment (A)  Final    Comment: Beta hemolytic Streptococcus, group B Penicillin and ampicillin are drugs of choice for treatment of beta-hemolytic streptococcal infections. Susceptibility testing of penicillins and other beta-lactam agents approved by the FDA for treatment of beta-hemolytic streptococcal infections need not be performed routinely because nonsusceptible isolates are extremely rare in any beta-hemolytic streptococcus and have not been reported for Streptococcus pyogenes (group A). (CLSI) 50,000-100,000 colony forming units per mL    ORGANISM ID, BACTERIA Comment  Final    Comment: Mixed urogenital flora Less than 10,000 colonies/mL   Blood culture (routine x 2)     Status: None (Preliminary result)   Collection Time: 03/16/21  5:58 PM   Specimen: BLOOD  Result Value Ref Range Status   Specimen Description BLOOD LEFT ANTECUBITAL  Final   Special Requests   Final    BOTTLES DRAWN AEROBIC AND ANAEROBIC Blood Culture adequate volume   Culture   Final    NO GROWTH 2 DAYS Performed at [facility], [address] [place], [place] [address]    Report Status PENDING  Incomplete  Culture, blood (single) w Reflex to ID Panel     Status: None (Preliminary result)   Collection Time: 03/16/21  7:44 PM   Specimen: BLOOD  Result Value Ref Range Status   Specimen Description BLOOD RIGHT ANTECUBITAL  Final  Special Requests   Final    BOTTLES DRAWN AEROBIC AND ANAEROBIC Blood Culture adequate volume   Culture   Final    NO GROWTH 2 DAYS Performed at [facility], [address] [place], [place] [address]    Report Status PENDING  Incomplete  Resp Panel by RT-PCR (Flu A&B, Covid) Nasopharyngeal Swab     Status: None   Collection Time: 03/16/21  7:45 PM   Specimen: Nasopharyngeal Swab; Nasopharyngeal(NP) swabs in vial transport medium  Result Value Ref Range Status    SARS Coronavirus 2 by RT PCR NEGATIVE NEGATIVE Final    Comment: (NOTE) SARS-CoV-2 target nucleic acids are NOT DETECTED.  The SARS-CoV-2 RNA is generally detectable in upper respiratory specimens during the acute phase of infection. The lowest concentration of SARS-CoV-2 viral copies this assay can detect is 138 copies/mL. A negative result does not preclude SARS-Cov-2 infection and should not be used as the sole basis for treatment or other patient management decisions. A negative result may occur with  improper specimen collection/handling, submission of specimen other than nasopharyngeal swab, presence of viral mutation(s) within the areas targeted by this assay, and inadequate number of viral copies(<138 copies/mL). A negative result must be combined with clinical observations, patient history, and epidemiological information. The expected result is Negative.  Fact Sheet for Patients:  EntrepreneurPulse.com.au  Fact Sheet for Healthcare Providers:  IncredibleEmployment.be  This test is no t yet approved or cleared by the [place] FDA and  has been authorized for detection and/or diagnosis of SARS-CoV-2 by FDA under an Emergency Use Authorization (EUA). This EUA will remain  in effect (meaning this test can be used) for the duration of the COVID-19 declaration under Section 564(b)(1) of the Act, 21 U.S.C.section 360bbb-3(b)(1), unless the authorization is terminated  or revoked sooner.       Influenza A by PCR NEGATIVE NEGATIVE Final   Influenza B by PCR NEGATIVE NEGATIVE Final    Comment: (NOTE) The Xpert Xpress SARS-CoV-2/FLU/RSV plus assay is intended as an aid in the diagnosis of influenza from Nasopharyngeal swab specimens and should not be used as a sole basis for treatment. Nasal washings and aspirates are unacceptable for Xpert Xpress SARS-CoV-2/FLU/RSV testing.  Fact Sheet for  Patients: EntrepreneurPulse.com.au  Fact Sheet for Healthcare Providers: IncredibleEmployment.be  This test is not yet approved or cleared by the [place] FDA and has been authorized for detection and/or diagnosis of SARS-CoV-2 by FDA under an Emergency Use Authorization (EUA). This EUA will remain in effect (meaning this test can be used) for the duration of the COVID-19 declaration under Section 564(b)(1) of the Act, 21 U.S.C. section 360bbb-3(b)(1), unless the authorization is terminated or revoked.  Performed at [facility], [address], [place], [place] [address]   Urine Culture     Status: None   Collection Time: 03/17/21  3:27 PM   Specimen: Urine, Random  Result Value Ref Range Status   Specimen Description   Final    URINE, RANDOM Performed at [facility], [address] [place], [place] [address]    Special Requests   Final    NONE Performed at [facility], [address], [place], [place] [address]    Culture   Final    NO GROWTH Performed at [facility], [place] [address] [place], [place] [address]    Report Status 03/19/2021 FINAL  Final    Procedures and diagnostic studies:  No results found.  LOS: 2 days   Warnell Rasnic  [facility]   Pager on [email or web address]. If 7PM-7AM, please contact night-coverage at [email or web address]     03/19/2021, 2:00 PM

## 2021-03-19 NOTE — Evaluation (Addendum)
Physical Therapy Evaluation Patient Details Name: Janet Phillips MRN: [id number] DOB: [birth year] Today's Date: 03/19/2021  History of Present Illness  Patient is a 71 year old female admitted with severe sepsis secondary to acute pyelonephritis. Past medical history significant for depression, anxiety, hypertension, GERD.  Clinical Impression  Patient needs maximal encouragement to participate with mobility and expressed multiple times she just wants to go to sleep. Supportive son at the bedside. The patient lives with her spouse and is independent with mobility at baseline without assistive device.  With encouragement for participation, patient required Min guard assistance for transfers and ambulating a short distance in the room. Walking was self limited. No shortness of breath with activity, however patient reports multiple times that she is tired and wants to go back to bed. Sp02 95% on room air after walking. Educated patient and son on walking for conditioning, increasing activity, and establishing a walking schedule at home. Anticipate the patient will be able to return home with family support and home health PT recommended. Recommend for PT to follow up while in the hospital to maximize independence and facilitate return to prior level of function.      Recommendations for follow up therapy are one component of a multi-disciplinary discharge planning process, led by the attending physician.  Recommendations may be updated based on patient status, additional functional criteria and insurance authorization.  Follow Up Recommendations Home health PT    Assistance Recommended at Discharge Set up Supervision/Assistance  Patient can return home with the following  A little help with walking and/or transfers;Direct supervision/assist for medications management;Assistance with cooking/housework;Help with stairs or ramp for entrance    Equipment Recommendations None recommended by PT   Recommendations for Other Services       Functional Status Assessment Patient has had a recent decline in their functional status and demonstrates the ability to make significant improvements in function in a reasonable and predictable amount of time.     Precautions / Restrictions Precautions Precautions: Fall Restrictions Weight Bearing Restrictions: No      Mobility  Bed Mobility Overal bed mobility: Needs Assistance Bed Mobility: Supine to Sit, Sit to Supine     Supine to sit: Supervision, HOB elevated Sit to supine: Supervision, HOB elevated   General bed mobility comments: increased time required to complete tasks. occasional cues for task initiation and sequencing    Transfers Overall transfer level: Needs assistance Equipment used: None Transfers: Sit to/from Stand Sit to Stand: Min guard           General transfer comment: patient looking for therapist to assist her with sit to stand transfers, however was able to complete with Min guard assistance with cues for hand placement and task initiation    Ambulation/Gait Ambulation/Gait assistance: Min guard Gait Distance (Feet): 25 Feet Assistive device: 1 person hand held assist Gait Pattern/deviations: Step-through pattern Gait velocity: decreased     General Gait Details: patient needs maximal encouragement to ambulate in the room and declined walking further due to wanting to return to bed to sleep. no loss of balance with walking with HHA provided more for reassurance and encouragement to walk and increase activity. Sp02 95%on room air and heart rate 90bpm after walking. no shortness of breath noted with activity  Stairs            Wheelchair Mobility    Modified Rankin (Stroke Patients Only)       Balance Overall balance assessment: Needs assistance Sitting-balance support: Feet supported  Sitting balance-Leahy Scale: Good     Standing balance support: Single extremity supported Standing  balance-Leahy Scale: Fair                               Pertinent Vitals/Pain Pain Assessment Pain Assessment: No/denies pain    Home Living Family/patient expects to be discharged to:: Private residence Living Arrangements: Spouse/significant other Available Help at Discharge: Family;Available 24 hours/day Type of Home: House Home Access: Stairs to enter   [employer] of Steps: 2   Home Layout: One level Home Equipment: BSC/3in1 Additional Comments: son recently provided with [facility], however patient has not been using this at home    Prior Function Prior Level of Function : Independent/Modified Independent             Mobility Comments: independent with ambulation ADLs Comments: per son report, spouse assists her however she does not necessarily need physical assistance to complete ADL tasks at baseline     Hand Dominance        Extremity/Trunk Assessment   Upper Extremity Assessment Upper Extremity Assessment: Generalized weakness    Lower Extremity Assessment Lower Extremity Assessment: Generalized weakness       Communication   Communication: No difficulties  Cognition Arousal/Alertness: Awake/alert Behavior During Therapy: Flat affect Overall Cognitive Status: Impaired/Different from baseline Area of Impairment: Memory, Following commands                     Memory: Decreased short-term memory Following Commands: Follows one step commands with increased time (with encouragement, with repetition)                General Comments General comments (skin integrity, edema, etc.): patient is fatigued with minimal activity. she needs encourgaement to particiapte with therapy. educated patient and son on routine ambulation, walking schedule at home for upright conditioning.    Exercises     Assessment/Plan    PT Assessment Patient needs continued PT services  PT Problem List Decreased strength;Decreased activity  tolerance;Decreased balance;Decreased mobility       PT Treatment Interventions DME instruction;Gait training;Stair training;Functional mobility training;Therapeutic activities;Therapeutic exercise;Balance training;Neuromuscular re-education;Cognitive remediation;Patient/family education    PT Goals (Current goals can be found in the Care Plan section)  Acute Rehab PT Goals Patient Stated Goal: to return home PT Goal Formulation: With patient/family Time For Goal Achievement: 04/02/21 Potential to Achieve Goals: Good    Frequency Min 2X/week     Co-evaluation               AM-PAC PT "6 Clicks" Mobility  Outcome Measure Help needed turning from your back to your side while in a flat bed without using bedrails?: None Help needed moving from lying on your back to sitting on the side of a flat bed without using bedrails?: A Little Help needed moving to and from a bed to a chair (including a wheelchair)?: A Little Help needed standing up from a chair using your arms (e.g., wheelchair or bedside chair)?: A Little Help needed to walk in hospital room?: A Little Help needed climbing 3-5 steps with a railing? : A Little 6 Click Score: 19    End of Session Equipment Utilized During Treatment: Gait belt Activity Tolerance: Patient limited by fatigue Patient left: in bed;with call bell/phone within reach;with bed alarm set;with family/visitor present (son at bedside)   PT Visit Diagnosis: Unsteadiness on feet (R26.81);Muscle weakness (generalized) (M62.81)  Time: 1683-7290 PT Time Calculation (min) (ACUTE ONLY): 23 min   Charges:   PT Evaluation $PT Eval Low Complexity: 1 Low PT Treatments $Therapeutic Activity: 8-22 mins        Minna Merritts, PT, MPT  Percell Locus 03/19/2021, 9:43 AM

## 2021-03-20 DIAGNOSIS — R6520 Severe sepsis without septic shock: Secondary | ICD-10-CM | POA: Diagnosis not present

## 2021-03-20 DIAGNOSIS — A401 Sepsis due to streptococcus, group B: Secondary | ICD-10-CM | POA: Diagnosis not present

## 2021-03-20 DIAGNOSIS — N12 Tubulo-interstitial nephritis, not specified as acute or chronic: Secondary | ICD-10-CM | POA: Diagnosis not present

## 2021-03-20 LAB — CBC WITH DIFFERENTIAL/PLATELET
Abs Immature Granulocytes: 0.03 10*3/uL (ref 0.00–0.07)
Basophils Absolute: 0 10*3/uL (ref 0.0–0.1)
Basophils Relative: 0 %
Eosinophils Absolute: 0.3 10*3/uL (ref 0.0–0.5)
Eosinophils Relative: 3 %
HCT: 34.3 % — ABNORMAL LOW (ref 36.0–46.0)
Hemoglobin: 10.6 g/dL — ABNORMAL LOW (ref 12.0–15.0)
Immature Granulocytes: 0 %
Lymphocytes Relative: 17 %
Lymphs Abs: 1.6 10*3/uL (ref 0.7–4.0)
MCH: 26.4 pg (ref 26.0–34.0)
MCHC: 30.9 g/dL (ref 30.0–36.0)
MCV: 85.5 fL (ref 80.0–100.0)
Monocytes Absolute: 0.8 10*3/uL (ref 0.1–1.0)
Monocytes Relative: 8 %
Neutro Abs: 6.5 10*3/uL (ref 1.7–7.7)
Neutrophils Relative %: 72 %
Platelets: 220 10*3/uL (ref 150–400)
RBC: 4.01 MIL/uL (ref 3.87–5.11)
RDW: 13.2 % (ref 11.5–15.5)
WBC: 9.2 10*3/uL (ref 4.0–10.5)
nRBC: 0 % (ref 0.0–0.2)

## 2021-03-20 LAB — BASIC METABOLIC PANEL
Anion gap: 8 (ref 5–15)
BUN: 31 mg/dL — ABNORMAL HIGH (ref 8–23)
CO2: 28 mmol/L (ref 22–32)
Calcium: 8.6 mg/dL — ABNORMAL LOW (ref 8.9–10.3)
Chloride: 106 mmol/L (ref 98–111)
Creatinine, Ser: 0.63 mg/dL (ref 0.44–1.00)
GFR, Estimated: >60 mL/min (ref >60)
Glucose, Bld: 145 mg/dL — ABNORMAL HIGH (ref 70–99)
Potassium: 3.3 mmol/L — ABNORMAL LOW (ref 3.5–5.1)
Sodium: 142 mmol/L (ref 135–145)

## 2021-03-20 LAB — PHOSPHORUS: Phosphorus: 2.8 mg/dL (ref 2.5–4.6)

## 2021-03-20 MED ORDER — CEFDINIR 300 MG PO CAPS: 300.0000 mg | ORAL_CAPSULE | Freq: Two times a day (BID) | ORAL | 0 refills | Status: AC

## 2021-03-20 MED ORDER — POTASSIUM CHLORIDE CRYS ER 20 MEQ PO TBCR
40.0000 meq | EXTENDED_RELEASE_TABLET | Freq: Once | ORAL | Status: AC
  Administered 2021-03-20: 40 meq via ORAL
  Filled 2021-03-20: qty 2

## 2021-03-20 NOTE — Discharge Summary (Signed)
Physician Discharge Summary  Janet Phillips UMP:[id number] DOB: Jan 12, 1951 DOA: 03/16/2021  PCP: Adin Hector, MD  Admit date: 03/16/2021 Discharge date: 03/20/2021  Discharge disposition: Home with home health care   Recommendations for Outpatient Follow-Up:   Follow-up with PCP in 1 week   Discharge Diagnosis:   Principal Problem:   Pyelonephritis Active Problems:   Anxiety   Acid reflux   HLD (hyperlipidemia)   Leukocytosis   Constipation   Essential hypertension   AKI (acute kidney injury) ([place])   Severe sepsis with acute organ dysfunction due to group B Streptococcus (HCC)   Acute pyelonephritis   Hypernatremia   Hypokalemia   Malnutrition of moderate degree    Discharge Condition: Stable.  Diet recommendation:  Diet Order             Diet - low sodium heart healthy           Diet Heart Room service appropriate? Yes; Fluid consistency: Thin  Diet effective now                     Code Status: Full Code     Hospital Course:   [name] is a 71 y.o. female with medical history significant for cognitive decline over the past few months, depression, anxiety, hypertension, GERD, recent diagnosis of UTI in the outpatient setting (urine culture showed group B strep and on 03/12/2021), who presented to the hospital because of lower abdominal pain, increased frequency of micturition and general ill feeling.  She was hypoxic with oxygen saturation of 86% on room air.   She was admitted to the hospital for severe sepsis secondary to acute pyelonephritis complicated by acute hypoxic respiratory failure and acute kidney injury.  She was treated with IV fluids and empiric IV antibiotics.  She also had hyponatremia that improved with IV fluids.  Hypokalemia was corrected.  She was evaluated by PT who recommended home health therapy.  Her condition has improved and she is deemed stable for discharge to home today.  Discharge plan was discussed with the  patient and her husband at the bedside.       Discharge Exam:    Vitals:   03/19/21 1537 03/19/21 2047 03/20/21 0443 03/20/21 0811  BP: (!) 151/68 (!) 145/68 (!) 148/62 (!) 154/67  Pulse: 88 (!) 110 100 100  Resp: 18 19 19 18   Temp: 97.9 F (36.6 C) 99.1 F (37.3 C) 98.5 F (36.9 C) 98.6 F (37 C)  TempSrc: Oral Oral Oral Oral  SpO2: 95% 95% 94% 94%  Weight:      Height:         GEN: NAD SKIN: Warm and dry EYES: No pallor or icterus ENT: MMM CV: RRR PULM: CTA B ABD: soft, ND, NT, +BS CNS: AAO x 1 (person), non focal EXT: No edema or tenderness   The results of significant diagnostics from this hospitalization (including imaging, microbiology, ancillary and laboratory) are listed below for reference.     Procedures and Diagnostic Studies:   DG Chest 2 View  Result Date: 03/16/2021 CLINICAL DATA:  Urinary tract infection, altered level of consciousness, weakness EXAM: CHEST - 2 VIEW COMPARISON:  06/17/2020 FINDINGS: Frontal and lateral views of the chest demonstrate a stable cardiac silhouette. No acute airspace disease, effusion, or pneumothorax. Left convex scoliosis centered at the thoracolumbar junction. No acute bony abnormality. IMPRESSION: 1. No acute intrathoracic process. Electronically Signed   By: Diana Eves.D.  On: 03/16/2021 18:20   CT Abdomen Pelvis W Contrast  Result Date: 03/16/2021 CLINICAL DATA:  Abdominal pain EXAM: CT ABDOMEN AND PELVIS WITH CONTRAST TECHNIQUE: Multidetector CT imaging of the abdomen and pelvis was performed using the standard protocol following bolus administration of intravenous contrast. RADIATION DOSE REDUCTION: This exam was performed according to the departmental dose-optimization program which includes automated exposure control, adjustment of the mA and/or kV according to patient size and/or use of iterative reconstruction technique. CONTRAST:  70mL OMNIPAQUE IOHEXOL 300 MG/ML  SOLN COMPARISON:  11/14/2018 FINDINGS:  Lower chest: No acute abnormality. Hepatobiliary: Hepatic steatosis. No focal hepatic lesion. No intra or extrahepatic biliary ductal dilatation. The hepatic and portal veins are patent. The gallbladder is somewhat distended but otherwise unremarkable. Pancreas: Unremarkable. No pancreatic ductal dilatation or surrounding inflammatory changes. Spleen: Normal in size without focal abnormality. Adrenals/Urinary Tract: The adrenal glands are unremarkable. The kidneys enhance symmetrically with no hydronephrosis. The bladder is unremarkable for degree of distension. Stomach/Bowel: Stomach is within normal limits. Appendix appears normal. No evidence of bowel wall thickening, distention, or inflammatory changes. Diverticulosis without evidence of diverticulitis. In the rectum, there is a large amount of stool, which measures up to 7.2 cm, without evidence of stercoral colitis. The largest single discrete stool ball measures up to 5.1 x 5.7 x 5.9 cm. Vascular/Lymphatic: Aortic atherosclerosis. No enlarged abdominal or pelvic lymph nodes. Reproductive: Status post hysterectomy. Other: No free fluid or free air in the abdomen or pelvis. Musculoskeletal: S shaped curvature of the thoracolumbar spine. Counting from the lowest rib-bearing vertebral body, T12, where there are diminutive ribs, there are four lumbar type vertebral bodies, with sacralization of L5. 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 Trace anterolisthesis of L4 on L5. IMPRESSION: 1. Rectal stool ball, which measures up to 5.7 cm in the axial plane, without evidence of stercoral colitis. 2. Transitional anatomy, with sacralization of L5. Please correlate with imaging if any spinal intervention is planned. Electronically Signed   By: Merilyn Baba M.D.   On: 03/16/2021 19:56     Labs:   Basic Metabolic Panel: Recent Labs  Lab 03/16/21 1758 03/17/21 0736 03/18/21 0356 03/19/21 0407 03/20/21 0548  NA 155* 153*  147* 146* 142  K 3.4* 3.1* 3.5 3.6 3.3*  CL 115* 116* 110 113* 106  CO2 28 29 29 29 28   GLUCOSE 151* 114* 128* 118* 145*  BUN 73* 50* 36* 35* 31*  CREATININE 1.27* 0.87 0.69 0.62 0.63  CALCIUM 10.4* 9.3 9.1 8.7* 8.6*  MG  --  2.7* 2.3  --   --   PHOS  --  2.4*  --  2.2* 2.8   GFR Estimated Creatinine Clearance: 51 mL/min (by C-G formula based on SCr of 0.63 mg/dL). Liver Function Tests: Recent Labs  Lab 03/16/21 1758  AST 28  ALT 28  ALKPHOS 124  BILITOT 0.8  PROT 7.8  ALBUMIN 4.3   Recent Labs  Lab 03/16/21 1758  LIPASE 32   No results for input(s): AMMONIA in the last 168 hours. Coagulation profile Recent Labs  Lab 03/16/21 1758 03/17/21 0736  INR 1.2 1.2    CBC: Recent Labs  Lab 03/16/21 1758 03/17/21 0736 03/18/21 0356 03/19/21 0407 03/20/21 0548  WBC 19.1* 12.9* 12.6* 11.3* 9.2  NEUTROABS 15.4* 9.7* 9.4* 7.9* 6.5  HGB 14.3 12.0 11.3* 11.0* 10.6*  HCT 47.9* 39.0 37.6 36.0 34.3*  MCV 90.4 86.7 89.7 87.6 85.5  PLT 402* 247 208  209 220   Cardiac Enzymes: No results for input(s): CKTOTAL, CKMB, CKMBINDEX, TROPONINI in the last 168 hours. BNP: Invalid input(s): POCBNP CBG: No results for input(s): GLUCAP in the last 168 hours. D-Dimer No results for input(s): DDIMER in the last 72 hours. Hgb A1c No results for input(s): HGBA1C in the last 72 hours. Lipid Profile No results for input(s): CHOL, HDL, LDLCALC, TRIG, CHOLHDL, LDLDIRECT in the last 72 hours. Thyroid function studies No results for input(s): TSH, T4TOTAL, T3FREE, THYROIDAB in the last 72 hours.  Invalid input(s): FREET3 Anemia work up No results for input(s): VITAMINB12, FOLATE, FERRITIN, TIBC, IRON, RETICCTPCT in the last 72 hours. Microbiology Recent Results (from the past 240 hour(s))  Urine Culture     Status: Abnormal   Collection Time: 03/12/21  2:40 AM   Specimen: Urine   UC  Result Value Ref Range Status   Urine Culture, Routine Final report (A)  Final   Organism ID,  Bacteria Comment (A)  Final    Comment: Beta hemolytic Streptococcus, group B Penicillin and ampicillin are drugs of choice for treatment of beta-hemolytic streptococcal infections. Susceptibility testing of penicillins and other beta-lactam agents approved by the FDA for treatment of beta-hemolytic streptococcal infections need not be performed routinely because nonsusceptible isolates are extremely rare in any beta-hemolytic streptococcus and have not been reported for Streptococcus pyogenes (group A). (CLSI) 50,000-100,000 colony forming units per mL    ORGANISM ID, BACTERIA Comment  Final    Comment: Mixed urogenital flora Less than 10,000 colonies/mL   Blood culture (routine x 2)     Status: None (Preliminary result)   Collection Time: 03/16/21  5:58 PM   Specimen: BLOOD  Result Value Ref Range Status   Specimen Description BLOOD LEFT ANTECUBITAL  Final   Special Requests   Final    BOTTLES DRAWN AEROBIC AND ANAEROBIC Blood Culture adequate volume   Culture   Final    NO GROWTH 4 DAYS Performed at [facility], [address] [place], [place] [address]    Report Status PENDING  Incomplete  Culture, blood (single) w Reflex to ID Panel     Status: None (Preliminary result)   Collection Time: 03/16/21  7:44 PM   Specimen: BLOOD  Result Value Ref Range Status   Specimen Description BLOOD RIGHT ANTECUBITAL  Final   Special Requests   Final    BOTTLES DRAWN AEROBIC AND ANAEROBIC Blood Culture adequate volume   Culture   Final    NO GROWTH 4 DAYS Performed at [facility], [address], [place], [place] [address]    Report Status PENDING  Incomplete  Resp Panel by RT-PCR (Flu A&B, Covid) Nasopharyngeal Swab     Status: None   Collection Time: 03/16/21  7:45 PM   Specimen: Nasopharyngeal Swab; Nasopharyngeal(NP) swabs in vial transport medium  Result Value Ref Range Status   SARS Coronavirus 2 by RT PCR NEGATIVE NEGATIVE Final    Comment:  (NOTE) SARS-CoV-2 target nucleic acids are NOT DETECTED.  The SARS-CoV-2 RNA is generally detectable in upper respiratory specimens during the acute phase of infection. The lowest concentration of SARS-CoV-2 viral copies this assay can detect is 138 copies/mL. A negative result does not preclude SARS-Cov-2 infection and should not be used as the sole basis for treatment or other patient management decisions. A negative result may occur with  improper specimen collection/handling, submission of specimen other than nasopharyngeal swab, presence of viral mutation(s) within the areas targeted by this assay,  and inadequate number of viral copies(<138 copies/mL). A negative result must be combined with clinical observations, patient history, and epidemiological information. The expected result is Negative.  Fact Sheet for Patients:  EntrepreneurPulse.com.au  Fact Sheet for Healthcare Providers:  IncredibleEmployment.be  This test is no t yet approved or cleared by the [place] FDA and  has been authorized for detection and/or diagnosis of SARS-CoV-2 by FDA under an Emergency Use Authorization (EUA). This EUA will remain  in effect (meaning this test can be used) for the duration of the COVID-19 declaration under Section 564(b)(1) of the Act, 21 U.S.C.section 360bbb-3(b)(1), unless the authorization is terminated  or revoked sooner.       Influenza A by PCR NEGATIVE NEGATIVE Final   Influenza B by PCR NEGATIVE NEGATIVE Final    Comment: (NOTE) The Xpert Xpress SARS-CoV-2/FLU/RSV plus assay is intended as an aid in the diagnosis of influenza from Nasopharyngeal swab specimens and should not be used as a sole basis for treatment. Nasal washings and aspirates are unacceptable for Xpert Xpress SARS-CoV-2/FLU/RSV testing.  Fact Sheet for Patients: EntrepreneurPulse.com.au  Fact Sheet for Healthcare  Providers: IncredibleEmployment.be  This test is not yet approved or cleared by the [place] FDA and has been authorized for detection and/or diagnosis of SARS-CoV-2 by FDA under an Emergency Use Authorization (EUA). This EUA will remain in effect (meaning this test can be used) for the duration of the COVID-19 declaration under Section 564(b)(1) of the Act, 21 U.S.C. section 360bbb-3(b)(1), unless the authorization is terminated or revoked.  Performed at [facility], [address], [place], [place] [address]   Urine Culture     Status: None   Collection Time: 03/17/21  3:27 PM   Specimen: Urine, Random  Result Value Ref Range Status   Specimen Description   Final    URINE, RANDOM Performed at [facility], [address] [place], [place] [address]    Special Requests   Final    NONE Performed at [facility], [address], [place], [place] [address]    Culture   Final    NO GROWTH Performed at [facility], [place] [address], [place], [place] [address]    Report Status 03/19/2021 FINAL  Final     Discharge Instructions:   Discharge Instructions     Diet - low sodium heart healthy   Complete by: As directed    Increase activity slowly   Complete by: As directed       Allergies as of 03/20/2021       Reactions   Azithromycin Other (See Comments)   Unknown reaction   Beclomethasone Other (See Comments)   unknown   Doxycycline Nausea Only   Erythromycin Base Nausea And Vomiting   Hydrocodone-acetaminophen Nausea Only, Nausea And Vomiting   Lansoprazole Nausea And Vomiting   Levofloxacin In D5w Nausea Only   Penicillin V Potassium Hives   Prednisone Swelling   Sulfa Antibiotics Other (See Comments)   unknown   Amoxicillin Rash   Cefuroxime Axetil Rash   Codeine Palpitations        Medication List     STOP taking these medications    nitrofurantoin (macrocrystal-monohydrate) 100 MG  capsule Commonly known as: Macrobid       TAKE these medications    acetaminophen 500 MG tablet Commonly known as: TYLENOL Take 500 mg by mouth daily as needed (pain.).   ALPRAZolam 0.5 MG tablet Commonly known as: XANAX Take 0.5 mg  by mouth in the morning and at bedtime.   cefdinir 300 MG capsule Commonly known as: OMNICEF Take 1 capsule (300 mg total) by mouth 2 (two) times daily for 3 days.   fluticasone 50 MCG/ACT nasal spray Commonly known as: FLONASE Place into both nostrils as needed for allergies or rhinitis.   omeprazole 40 MG capsule Commonly known as: PRILOSEC Take 40 mg by mouth in the morning.   oxyCODONE-acetaminophen 5-325 MG tablet Commonly known as: PERCOCET/ROXICET Take 1 tablet by mouth every 4 (four) hours as needed for moderate pain.   phenazopyridine 200 MG tablet Commonly known as: Pyridium Take 1 tablet (200 mg total) by mouth 3 (three) times daily as needed for pain (urethral spasm).   verapamil 120 MG CR tablet Commonly known as: CALAN-SR Take 120 mg by mouth daily after supper.           If you experience worsening of your admission symptoms, develop shortness of breath, life threatening emergency, suicidal or homicidal thoughts you must seek medical attention immediately by calling 911 or calling your MD immediately  if symptoms less severe.   You must read complete instructions/literature along with all the possible adverse reactions/side effects for all the medicines you take and that have been prescribed to you. Take any new medicines after you have completely understood and accept all the possible adverse reactions/side effects.    Please note   You were cared for by a hospitalist during your hospital stay. If you have any questions about your discharge medications or the care you received while you were in the hospital after you are discharged, you can call the unit and asked to speak with the hospitalist on call if the hospitalist  that took care of you is not available. Once you are discharged, your primary care physician will handle any further medical issues. Please note that NO REFILLS for any discharge medications will be authorized once you are discharged, as it is imperative that you return to your primary care physician (or establish a relationship with a primary care physician if you do not have one) for your aftercare needs so that they can reassess your need for medications and monitor your lab values.       Time coordinating discharge: Greater than 30 minutes  Signed:  Danya Spearman  [facility] 03/20/2021, 1:51 PM   Pager on [email or web address]. If 7PM-7AM, please contact night-coverage at [email or web address]

## 2021-03-20 NOTE — Care Management Important Message (Signed)
Important Message  Patient Details  Name: Janet Phillips MRN: [id number] Date of Birth: 03-06-1950   Medicare Important Message Given:  Yes  Patient asleep upon time of visit.  RN in room mentioned husband would be a good contact.  Left message with husband to review Medicare IM.  Copy of Medicare IM left in patient's room for reference.  Encouraged call back from husband in the event of any questions.   Dannette Barbara 03/20/2021, 1:33 PM

## 2021-03-20 NOTE — Progress Notes (Signed)
PT Cancellation Note  Patient Details Name: Janet Phillips MRN: [id number] DOB: 24-Feb-1950   Cancelled Treatment:    PT attempted earlier this date. Pt refused. Pt/spouse requested not having PT and state confidence in DC once cleared by MD.     Willette Pa 03/20/2021, 12:33 PM

## 2021-03-20 NOTE — Plan of Care (Deleted)
IV removed, wound vac changed to travel canister, discharge instructions reviewed and patient awaiting EMS for transport home

## 2021-03-21 LAB — CULTURE, BLOOD (ROUTINE X 2)
Culture: NO GROWTH
Special Requests: ADEQUATE

## 2021-03-21 LAB — CULTURE, BLOOD (SINGLE)
Culture: NO GROWTH
Special Requests: ADEQUATE

## 2021-03-30 ENCOUNTER — Other Ambulatory Visit: Payer: Self-pay | Admitting: General Surgery

## 2021-03-30 DIAGNOSIS — Z1231 Encounter for screening mammogram for malignant neoplasm of breast: Secondary | ICD-10-CM

## 2021-03-31 ENCOUNTER — Encounter: Payer: Self-pay | Admitting: Obstetrics & Gynecology

## 2021-03-31 ENCOUNTER — Other Ambulatory Visit: Payer: Self-pay | Admitting: Neurology

## 2021-03-31 ENCOUNTER — Ambulatory Visit (INDEPENDENT_AMBULATORY_CARE_PROVIDER_SITE_OTHER): Payer: Medicare HMO | Admitting: Obstetrics & Gynecology

## 2021-03-31 ENCOUNTER — Other Ambulatory Visit: Payer: Self-pay

## 2021-03-31 VITALS — BP 120/70 | Ht 62.0 in | Wt 111.0 lb

## 2021-03-31 DIAGNOSIS — R413 Other amnesia: Secondary | ICD-10-CM

## 2021-03-31 DIAGNOSIS — Z90710 Acquired absence of both cervix and uterus: Secondary | ICD-10-CM

## 2021-03-31 MED ORDER — ALOE VESTA 2-N-1 PROTECTIVE EX OINT: TOPICAL_OINTMENT | Freq: Two times a day (BID) | CUTANEOUS | 0 refills | Status: AC | PRN

## 2021-03-31 NOTE — Progress Notes (Signed)
°  Postoperative Follow-up Patient presents post op from vaginal hysterectomy and anterior colporrhaphy for pelvic relaxation, 6 weeks ago.  Subjective: Patient reports marked improvement in her preop symptoms. Eating a regular diet without difficulty. The patient is not having any pain.  Activity: normal activities of daily living. Patient reports additional symptom's since surgery of dehydration and constipation, followed by now diarrhea, after hospitalization for dehydration and UTI 2 weeks ago.  Pt also dealing w dementia.  Objective: BP 120/70    Ht 5\' 2"  (1.575 m)    Wt 111 lb (50.3 kg)    BMI 20.30 kg/m  Physical Exam Constitutional:      General: She is not in acute distress.    Appearance: She is well-developed.  Genitourinary:     Rectum normal.     Vaginal cuff intact.    Perineal sutures intact.    Vaginal tenderness present.     No vaginal erythema or bleeding.     Vaginal exam comments: Pt does not tolerate small spec exam well .      Right Adnexa: not tender and no mass present.    Left Adnexa: not tender and no mass present.    Cervix is absent.     Uterus is absent.  Cardiovascular:     Rate and Rhythm: Normal rate.  Pulmonary:     Effort: Pulmonary effort is normal.  Abdominal:     General: There is no distension.     Palpations: Abdomen is soft.     Tenderness: There is no abdominal tenderness.  Musculoskeletal:        General: Normal range of motion.  Neurological:     Mental Status: She is alert and oriented to person, place, and time.     Cranial Nerves: No cranial nerve deficit.  Skin:    General: Skin is warm and dry.    Assessment: s/p :  vaginal hysterectomy and anterior colporrhaphy progressing well  Plan: Patient has done well after surgery with no apparent complications.  I have discussed the post-operative course to date, and the expected progress moving forward.  The patient understands what complications to be concerned about.  I will see  the patient in routine follow up, or sooner if needed.    Activity plan: No restriction.  Hoyt Koch 03/31/2021, 9:57 AM

## 2021-04-04 ENCOUNTER — Encounter: Payer: Self-pay | Admitting: Radiology

## 2021-04-04 ENCOUNTER — Ambulatory Visit
Admission: RE | Admit: 2021-04-04 | Discharge: 2021-04-04 | Disposition: A | Discharge status: Home / Self Care | Payer: Medicare HMO | Source: Ambulatory Visit | Attending: Neurology | Admitting: Neurology

## 2021-04-04 DIAGNOSIS — R413 Other amnesia: Secondary | ICD-10-CM | POA: Diagnosis not present

## 2021-06-12 ENCOUNTER — Other Ambulatory Visit: Payer: Self-pay

## 2023-01-01 IMAGING — MG MM DIGITAL SCREENING BILAT W/ TOMO AND CAD
8 series · 8 of 24 positions shown · non-contrast
Comparison: Previous exam(s).

CLINICAL DATA: Screening.

EXAM:
DIGITAL SCREENING BILATERAL MAMMOGRAM WITH TOMOSYNTHESIS AND CAD
TECHNIQUE: Bilateral screening digital craniocaudal and mediolateral oblique
mammograms were obtained. Bilateral screening digital breast
tomosynthesis was performed. The images were evaluated with
computer-aided detection.

[L MLO synth-2D]
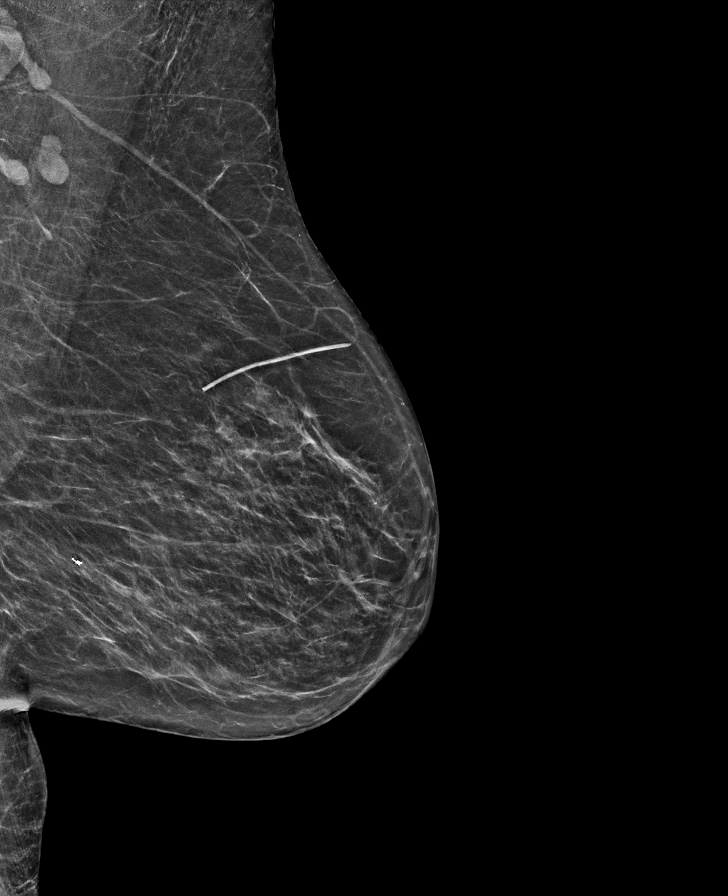

[L CC synth-2D]
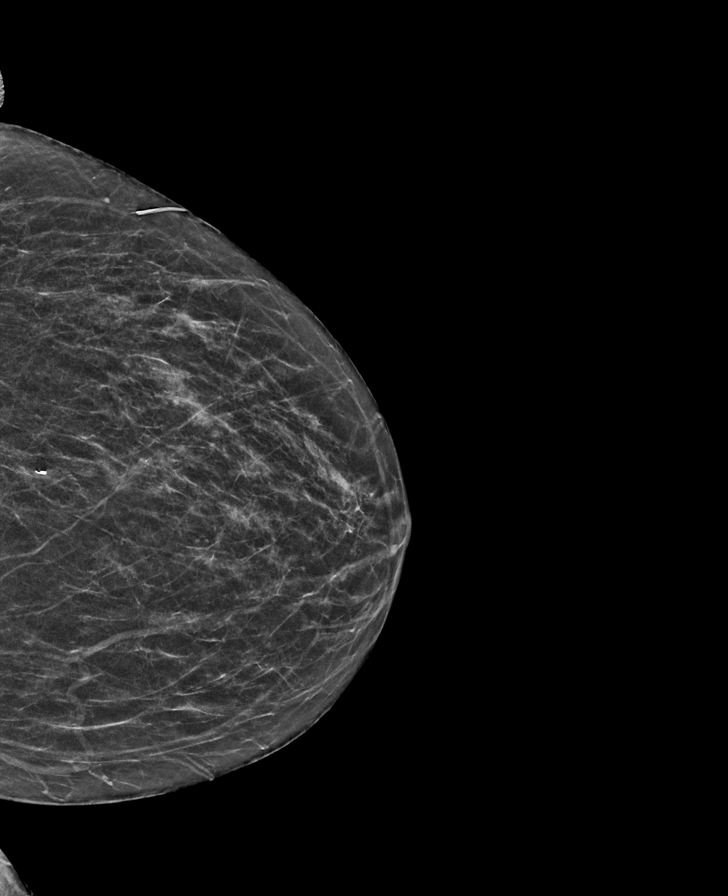

[R MLO synth-2D]
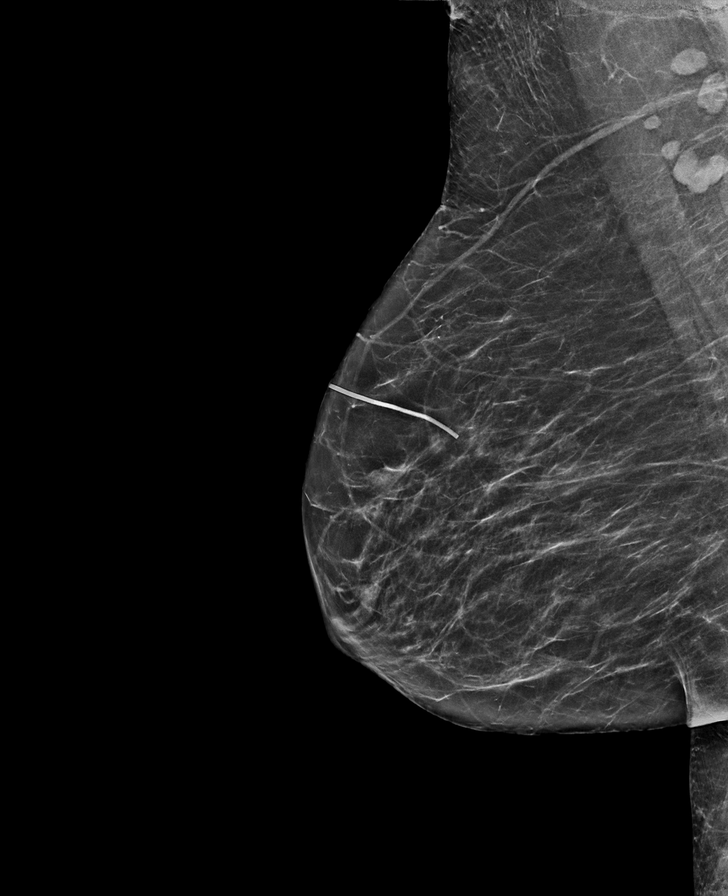

[R CC synth-2D]
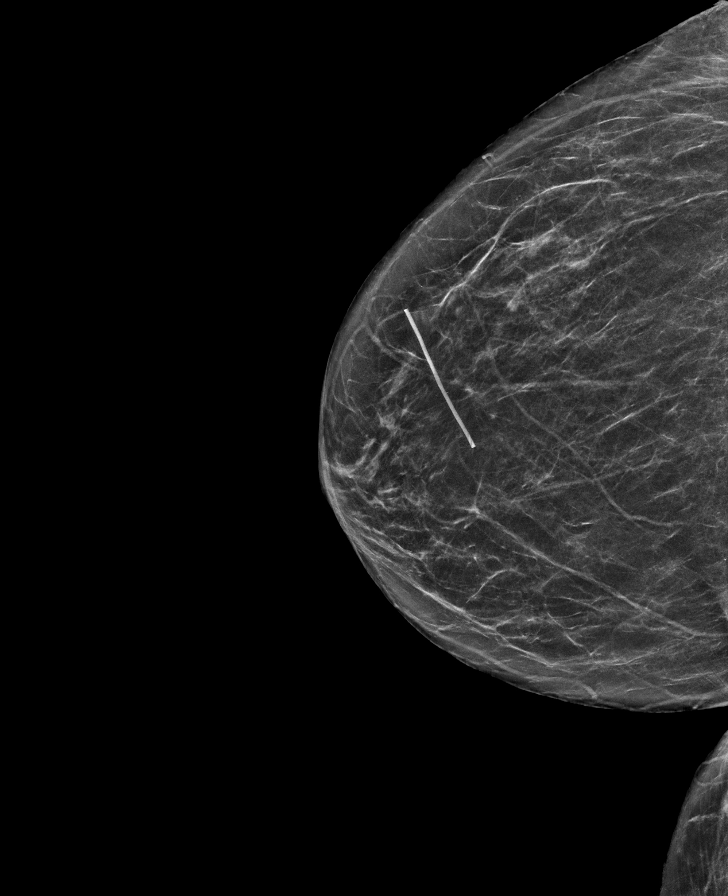

[L CC tomo · tomo slice 23/46.0]
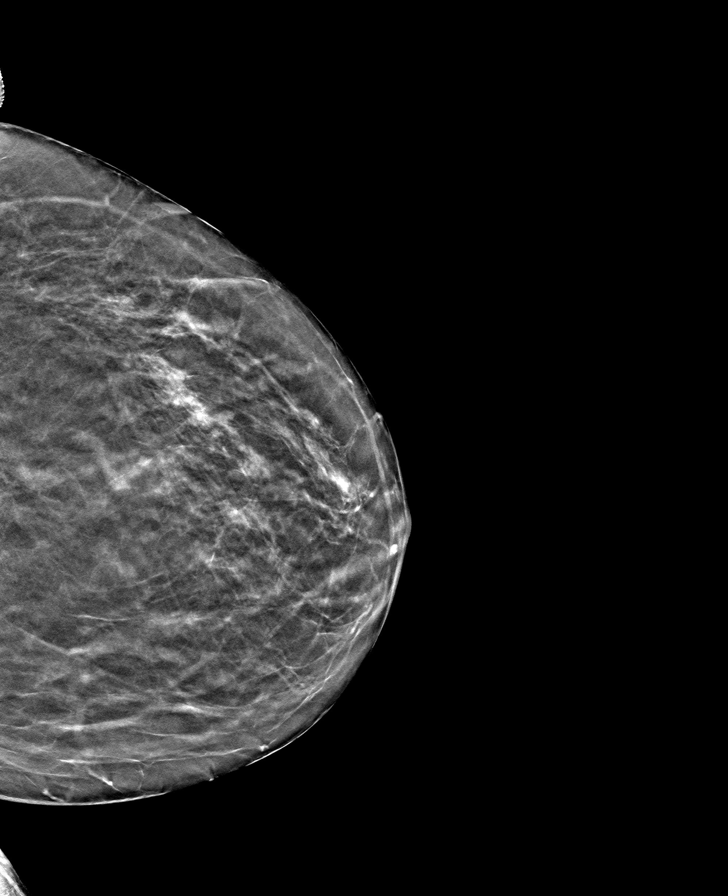

[R MLO tomo · tomo slice 31/61.0]
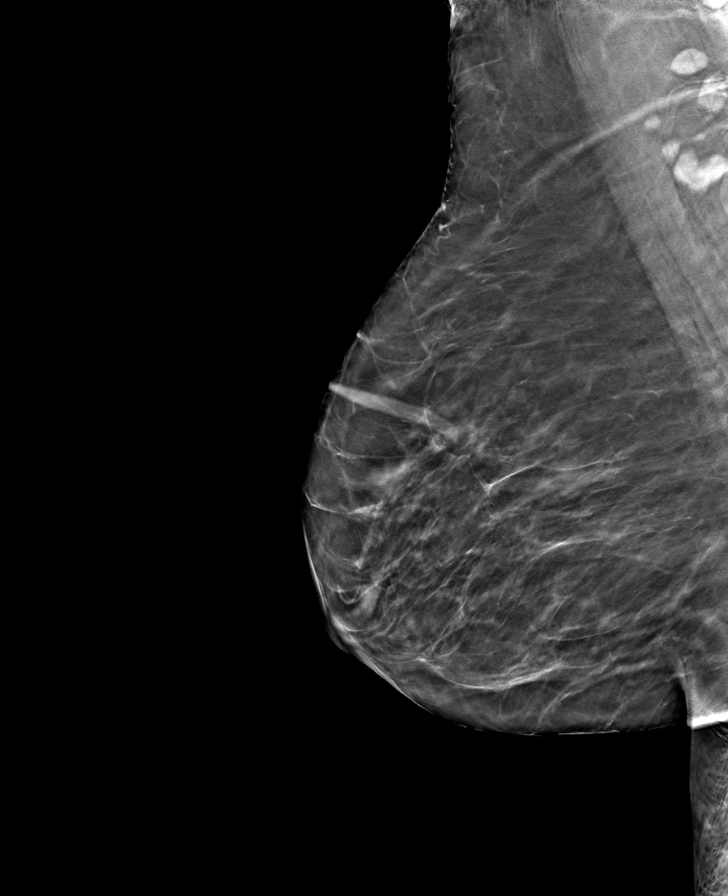

[R CC tomo · tomo slice 30/59.0]
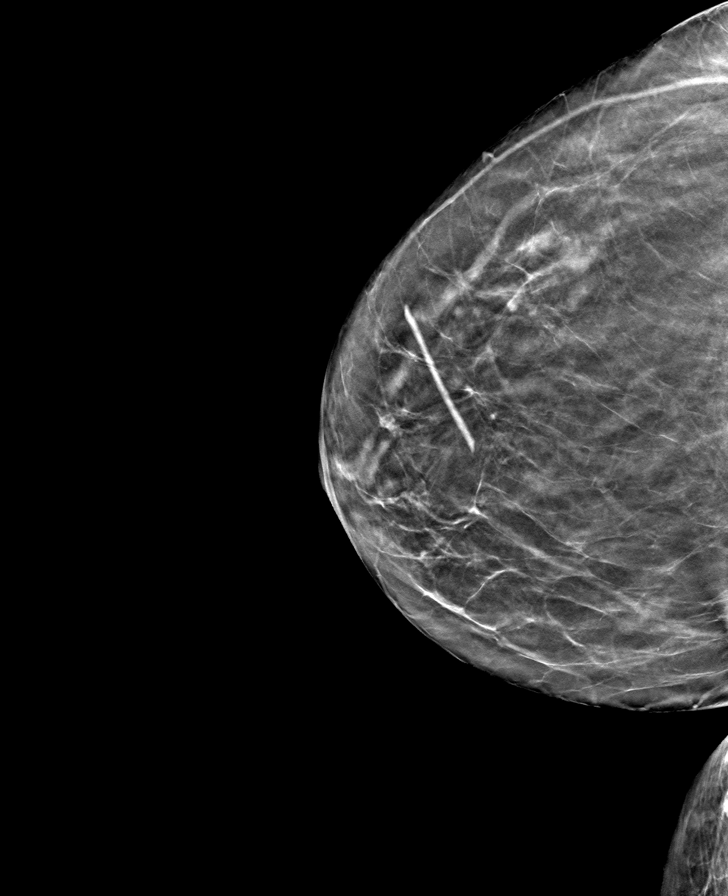

[L MLO tomo · tomo slice 29/58.0]
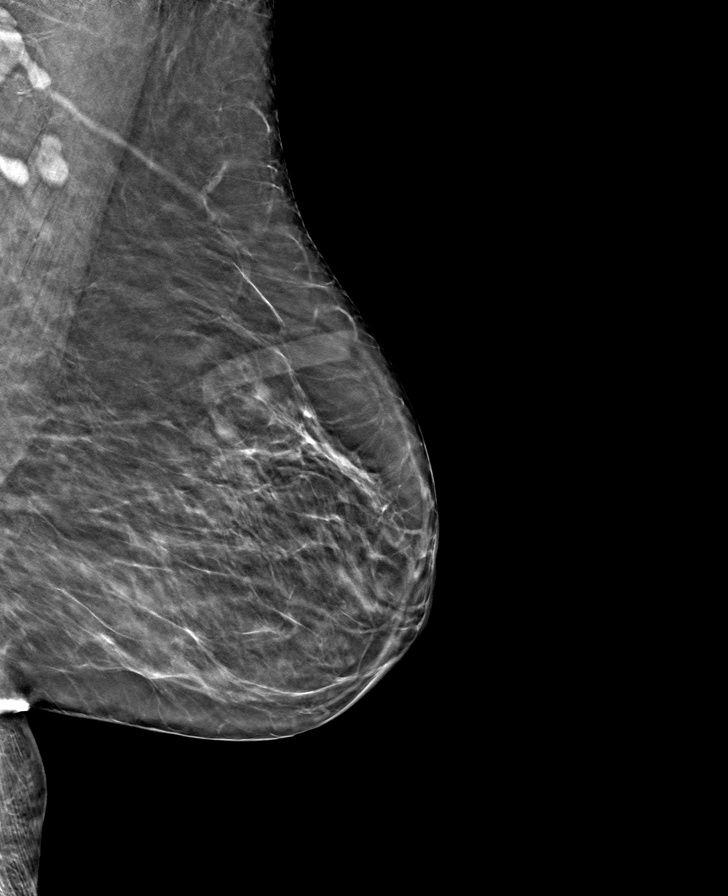

[8 of 24 positions shown; findings below may reference images not displayed]

ACR Breast Density Category b: There are scattered areas of
fibroglandular density.
FINDINGS: There are no findings suspicious for malignancy. The images were
evaluated with computer-aided detection.
IMPRESSION: No mammographic evidence of malignancy. A result letter of this
screening mammogram will be mailed directly to the patient.

RECOMMENDATION:
Screening mammogram in one year. (Code:WJ-[id number])

BI-RADS CATEGORY  1: Negative.

## 2023-02-24 DIAGNOSIS — M9905 Segmental and somatic dysfunction of pelvic region: Secondary | ICD-10-CM | POA: Diagnosis not present

## 2023-02-24 DIAGNOSIS — M5136 Other intervertebral disc degeneration, lumbar region with discogenic back pain only: Secondary | ICD-10-CM | POA: Diagnosis not present

## 2023-02-24 DIAGNOSIS — M9903 Segmental and somatic dysfunction of lumbar region: Secondary | ICD-10-CM | POA: Diagnosis not present

## 2023-02-24 DIAGNOSIS — M955 Acquired deformity of pelvis: Secondary | ICD-10-CM | POA: Diagnosis not present

## 2023-03-24 DIAGNOSIS — M9905 Segmental and somatic dysfunction of pelvic region: Secondary | ICD-10-CM | POA: Diagnosis not present

## 2023-03-24 DIAGNOSIS — M955 Acquired deformity of pelvis: Secondary | ICD-10-CM | POA: Diagnosis not present

## 2023-03-24 DIAGNOSIS — M9903 Segmental and somatic dysfunction of lumbar region: Secondary | ICD-10-CM | POA: Diagnosis not present

## 2023-03-24 DIAGNOSIS — M5136 Other intervertebral disc degeneration, lumbar region with discogenic back pain only: Secondary | ICD-10-CM | POA: Diagnosis not present

## 2023-04-21 DIAGNOSIS — M9905 Segmental and somatic dysfunction of pelvic region: Secondary | ICD-10-CM | POA: Diagnosis not present

## 2023-04-21 DIAGNOSIS — M955 Acquired deformity of pelvis: Secondary | ICD-10-CM | POA: Diagnosis not present

## 2023-04-21 DIAGNOSIS — M9903 Segmental and somatic dysfunction of lumbar region: Secondary | ICD-10-CM | POA: Diagnosis not present

## 2023-04-21 DIAGNOSIS — M5136 Other intervertebral disc degeneration, lumbar region with discogenic back pain only: Secondary | ICD-10-CM | POA: Diagnosis not present

## 2023-05-05 DIAGNOSIS — H6123 Impacted cerumen, bilateral: Secondary | ICD-10-CM | POA: Diagnosis not present

## 2023-05-05 DIAGNOSIS — H903 Sensorineural hearing loss, bilateral: Secondary | ICD-10-CM | POA: Diagnosis not present

## 2023-05-19 DIAGNOSIS — M955 Acquired deformity of pelvis: Secondary | ICD-10-CM | POA: Diagnosis not present

## 2023-05-19 DIAGNOSIS — M9905 Segmental and somatic dysfunction of pelvic region: Secondary | ICD-10-CM | POA: Diagnosis not present

## 2023-05-19 DIAGNOSIS — M5136 Other intervertebral disc degeneration, lumbar region with discogenic back pain only: Secondary | ICD-10-CM | POA: Diagnosis not present

## 2023-05-19 DIAGNOSIS — M9903 Segmental and somatic dysfunction of lumbar region: Secondary | ICD-10-CM | POA: Diagnosis not present

## 2023-06-16 DIAGNOSIS — M9905 Segmental and somatic dysfunction of pelvic region: Secondary | ICD-10-CM | POA: Diagnosis not present

## 2023-06-16 DIAGNOSIS — M9903 Segmental and somatic dysfunction of lumbar region: Secondary | ICD-10-CM | POA: Diagnosis not present

## 2023-06-16 DIAGNOSIS — M5136 Other intervertebral disc degeneration, lumbar region with discogenic back pain only: Secondary | ICD-10-CM | POA: Diagnosis not present

## 2023-06-16 DIAGNOSIS — M955 Acquired deformity of pelvis: Secondary | ICD-10-CM | POA: Diagnosis not present

## 2023-06-21 DIAGNOSIS — R7303 Prediabetes: Secondary | ICD-10-CM | POA: Diagnosis not present

## 2023-06-21 DIAGNOSIS — E782 Mixed hyperlipidemia: Secondary | ICD-10-CM | POA: Diagnosis not present

## 2023-06-21 DIAGNOSIS — E538 Deficiency of other specified B group vitamins: Secondary | ICD-10-CM | POA: Diagnosis not present

## 2023-06-28 DIAGNOSIS — R600 Localized edema: Secondary | ICD-10-CM | POA: Diagnosis not present

## 2023-06-28 DIAGNOSIS — I10 Essential (primary) hypertension: Secondary | ICD-10-CM | POA: Diagnosis not present

## 2023-06-28 DIAGNOSIS — K580 Irritable bowel syndrome with diarrhea: Secondary | ICD-10-CM | POA: Diagnosis not present

## 2023-06-28 DIAGNOSIS — E538 Deficiency of other specified B group vitamins: Secondary | ICD-10-CM | POA: Diagnosis not present

## 2023-06-28 DIAGNOSIS — E782 Mixed hyperlipidemia: Secondary | ICD-10-CM | POA: Diagnosis not present

## 2023-06-28 DIAGNOSIS — E559 Vitamin D deficiency, unspecified: Secondary | ICD-10-CM | POA: Diagnosis not present

## 2023-06-28 DIAGNOSIS — Z0000 Encounter for general adult medical examination without abnormal findings: Secondary | ICD-10-CM | POA: Diagnosis not present

## 2023-06-28 DIAGNOSIS — R7303 Prediabetes: Secondary | ICD-10-CM | POA: Diagnosis not present

## 2023-06-28 DIAGNOSIS — Z86718 Personal history of other venous thrombosis and embolism: Secondary | ICD-10-CM | POA: Diagnosis not present

## 2023-06-28 DIAGNOSIS — F411 Generalized anxiety disorder: Secondary | ICD-10-CM | POA: Diagnosis not present

## 2023-06-28 DIAGNOSIS — E079 Disorder of thyroid, unspecified: Secondary | ICD-10-CM | POA: Diagnosis not present

## 2023-06-28 DIAGNOSIS — K219 Gastro-esophageal reflux disease without esophagitis: Secondary | ICD-10-CM | POA: Diagnosis not present

## 2023-06-30 DIAGNOSIS — M810 Age-related osteoporosis without current pathological fracture: Secondary | ICD-10-CM | POA: Diagnosis not present

## 2023-07-07 DIAGNOSIS — Z1211 Encounter for screening for malignant neoplasm of colon: Secondary | ICD-10-CM | POA: Diagnosis not present

## 2023-07-12 LAB — COLOGUARD: COLOGUARD: NEGATIVE

## 2023-07-14 DIAGNOSIS — M955 Acquired deformity of pelvis: Secondary | ICD-10-CM | POA: Diagnosis not present

## 2023-07-14 DIAGNOSIS — M9903 Segmental and somatic dysfunction of lumbar region: Secondary | ICD-10-CM | POA: Diagnosis not present

## 2023-07-14 DIAGNOSIS — M9905 Segmental and somatic dysfunction of pelvic region: Secondary | ICD-10-CM | POA: Diagnosis not present

## 2023-07-14 DIAGNOSIS — M5136 Other intervertebral disc degeneration, lumbar region with discogenic back pain only: Secondary | ICD-10-CM | POA: Diagnosis not present

## 2023-08-04 DIAGNOSIS — H6123 Impacted cerumen, bilateral: Secondary | ICD-10-CM | POA: Diagnosis not present

## 2023-08-04 DIAGNOSIS — M9905 Segmental and somatic dysfunction of pelvic region: Secondary | ICD-10-CM | POA: Diagnosis not present

## 2023-08-04 DIAGNOSIS — M5136 Other intervertebral disc degeneration, lumbar region with discogenic back pain only: Secondary | ICD-10-CM | POA: Diagnosis not present

## 2023-08-04 DIAGNOSIS — M9903 Segmental and somatic dysfunction of lumbar region: Secondary | ICD-10-CM | POA: Diagnosis not present

## 2023-08-04 DIAGNOSIS — H903 Sensorineural hearing loss, bilateral: Secondary | ICD-10-CM | POA: Diagnosis not present

## 2023-08-04 DIAGNOSIS — M955 Acquired deformity of pelvis: Secondary | ICD-10-CM | POA: Diagnosis not present

## 2023-08-10 ENCOUNTER — Emergency Department
Admission: EM | Admit: 2023-08-10 | Discharge: 2023-08-10 | Disposition: A | Discharge status: Home / Self Care | Attending: Emergency Medicine | Admitting: Emergency Medicine

## 2023-08-10 ENCOUNTER — Emergency Department

## 2023-08-10 DIAGNOSIS — W01198A Fall on same level from slipping, tripping and stumbling with subsequent striking against other object, initial encounter: Secondary | ICD-10-CM | POA: Diagnosis not present

## 2023-08-10 DIAGNOSIS — S42201A Unspecified fracture of upper end of right humerus, initial encounter for closed fracture: Secondary | ICD-10-CM | POA: Insufficient documentation

## 2023-08-10 DIAGNOSIS — S42291A Other displaced fracture of upper end of right humerus, initial encounter for closed fracture: Secondary | ICD-10-CM | POA: Diagnosis not present

## 2023-08-10 DIAGNOSIS — S4991XA Unspecified injury of right shoulder and upper arm, initial encounter: Secondary | ICD-10-CM | POA: Diagnosis not present

## 2023-08-10 DIAGNOSIS — S42211A Unspecified displaced fracture of surgical neck of right humerus, initial encounter for closed fracture: Secondary | ICD-10-CM | POA: Diagnosis not present

## 2023-08-10 DIAGNOSIS — S4291XA Fracture of right shoulder girdle, part unspecified, initial encounter for closed fracture: Secondary | ICD-10-CM | POA: Diagnosis not present

## 2023-08-10 DIAGNOSIS — Y9301 Activity, walking, marching and hiking: Secondary | ICD-10-CM | POA: Insufficient documentation

## 2023-08-10 DIAGNOSIS — S43014A Anterior dislocation of right humerus, initial encounter: Secondary | ICD-10-CM | POA: Diagnosis not present

## 2023-08-10 DIAGNOSIS — M85811 Other specified disorders of bone density and structure, right shoulder: Secondary | ICD-10-CM | POA: Diagnosis not present

## 2023-08-10 MED ORDER — OXYCODONE-ACETAMINOPHEN 5-325 MG PO TABS: 0.5000 | ORAL_TABLET | Freq: Four times a day (QID) | ORAL | 0 refills | Status: AC | PRN

## 2023-08-10 MED ORDER — FENTANYL CITRATE PF 50 MCG/ML IJ SOSY
50.0000 ug | PREFILLED_SYRINGE | Freq: Once | INTRAMUSCULAR | Status: AC
  Administered 2023-08-10: 50 ug via INTRAVENOUS
  Filled 2023-08-10: qty 1

## 2023-08-10 NOTE — ED Provider Notes (Signed)
 [facility] Provider Note    Event Date/Time   First MD Initiated Contact with Patient 08/10/23 1308     (approximate)   History   Shoulder Injury   HPI  Janet Phillips is a 73 year old female with history of osteopenia presenting to the emergency department for evaluation after a fall.  Patient was walking when she tripped over her shoe and had a witnessed fall onto her right shoulder.  Did not hit her head.  No LOC.  She presented to [facility] clinic where an x-Karilyn Wind reportedly demonstrated a right anterior shoulder dislocation.  She was directed to the ER for further evaluation.  Denies injury to other areas.     Physical Exam   Triage Vital Signs: ED Triage Vitals  Encounter Vitals Group     BP 08/10/23 1304 (!) 148/59     Girls Systolic BP Percentile --      Girls Diastolic BP Percentile --      Boys Systolic BP Percentile --      Boys Diastolic BP Percentile --      Pulse Rate 08/10/23 1304 75     Resp 08/10/23 1304 18     Temp 08/10/23 1304 98 F (36.7 C)     Temp Source 08/10/23 1304 Oral     SpO2 08/10/23 1304 100 %     Weight 08/10/23 1305 129 lb (58.5 kg)     Height 08/10/23 1305 5' 2 (1.575 m)     Head Circumference --      Peak Flow --      Pain Score 08/10/23 1305 0     Pain Loc --      Pain Education --      Exclude from Growth Chart --     Most recent vital signs: Vitals:   08/10/23 1400 08/10/23 1430  BP: (!) 152/58 (!) 160/47  Pulse: 72 71  Resp: 15 15  Temp:    SpO2: 98% 98%     General: Awake, interactive  Head:  Atraumatic CV:  Regular rate, good peripheral perfusion.  Resp:  Unlabored respirations.  Abd:  Nondistended.  Neuro:  Symmetric facial movement, fluid speech MSK:  There is pain over the right shoulder, no tenderness over the distal extremity, left upper, or bilateral lower extremities.  There are 2+ DP pulses.  Sensation is intact throughout the extremity.   ED Results / Procedures / Treatments    Labs (all labs ordered are listed, but only abnormal results are displayed) Labs Reviewed - No data to display   EKG EKG independently reviewed and interpreted by myself demonstrates:    RADIOLOGY Imaging independently reviewed and interpreted by myself demonstrates:  Postreduction shoulder x-Nikkia Devoss demonstrates mildly displaced transverse fracture of the surgical neck of the humerus without associated dislocation  Formal Radiology Read:  DG Shoulder Right Result Date: 08/10/2023 CLINICAL DATA:  Right anterior shoulder dislocation.  Post reduction EXAM: RIGHT SHOULDER - 2+ VIEW COMPARISON:  None Available. FINDINGS: Osteopenia.Mildly displaced, predominantly transverse fracture through the surgical neck of the humerus. No dislocation. Surgical anchors in the glenoid. There is no evidence of arthropathy or other focal bone abnormality. Soft tissues are unremarkable. IMPRESSION: Mildly displaced, predominately transverse fracture through the surgical neck of the humerus. No dislocation. Electronically Signed   By: Rogelia Myers M.D.   On: 08/10/2023 14:41    PROCEDURES:  Critical Care performed: No  .Reduction of dislocation  Date/Time: 08/10/2023 2:57 PM  Performed by: Levander Slate,  MD Authorized by: Levander Slate, MD  Consent: Verbal consent obtained Risks and benefits: risks, benefits and alternatives were discussed Consent given by: patient Patient understanding: patient states understanding of the procedure being performed Imaging studies: imaging studies available Patient identity confirmed: verbally with patient Time out: Immediately prior to procedure a time out was called to verify the correct patient, procedure, equipment, support staff and site/side marked as required. Local anesthesia used: no  Anesthesia: Local anesthesia used: no  Sedation: Patient sedated: no  Patient tolerance: patient tolerated the procedure well with no immediate  complications      MEDICATIONS ORDERED IN ED: Medications  fentaNYL  (SUBLIMAZE ) injection 50 mcg (50 mcg Intravenous Given 08/10/23 1325)     IMPRESSION / MDM / ASSESSMENT AND PLAN / ED COURSE  I reviewed the triage vital signs and the nursing notes.  Differential diagnosis includes, but is not limited to, fracture, dislocation, soft tissue injury  Patient's presentation is most consistent with acute complicated illness / injury requiring diagnostic workup.  73 year old female presenting with right shoulder pain after a fall.  Arrives from [facility] with reported anterior shoulder dislocation.  Initial delay in obtaining images as they did arrive on a CD.  I was able to review these on the computer at the radiology desk.  On my initial review, I do note a dislocation of the shoulder.  Did not initially note any obvious fracture.  In the setting of this, did proceed with reduction as above.  She was given fentanyl  for pain control.  Postreduction x-Teion Ballin did demonstrate reduction of prior noted dislocation, was notable for a humeral neck fracture.  Differential includes nondisplaced fracture initially with more notable displacement postreduction versus iatrogenic humerus fracture during shoulder reduction.  I did discuss the possibility of these with the patient.  She expressed understanding.  Did discuss management of her humeral fracture sling and swath.  Patient feels improved on reevaluation and is comfortable with discharge home.  Will DC with short course of pain medication and information for follow-up with orthopedics.  Strict return precautions provided.  Patient discharged in stable condition.     FINAL CLINICAL IMPRESSION(S) / ED DIAGNOSES   Final diagnoses:  Closed fracture of proximal end of right humerus, unspecified fracture morphology, initial encounter  Traumatic closed displaced fracture of right shoulder with anterior dislocation, initial encounter     Rx / DC  Orders   ED Discharge Orders          Ordered    oxyCODONE -acetaminophen  (PERCOCET) 5-325 MG tablet  Every 6 hours PRN        08/10/23 1513             Note:  This document was prepared using Dragon voice recognition software and may include unintentional dictation errors.   Levander Slate, MD 08/10/23 [phone]

## 2023-08-10 NOTE — Discharge Instructions (Addendum)
 You were seen in the emergency department today for evaluation of your shoulder pain after fall.  You did have a shoulder dislocation that we reduced.  As we discussed, your x-Hajime Asfaw did also show a fracture of your humerus.  Use your sling until otherwise directed.  Follow-up with orthopedics in the next 1 to 2 weeks.  You can take Tylenol  and ibuprofen to help with your pain if there is not another reason you should not take these.  If you are breakthrough pain, I sent a short course of narcotic medicine to your pharmacy.  Do not drive or operate machinery when taking this.  Return to the ER for new or worsening symptoms.

## 2023-08-10 NOTE — ED Triage Notes (Signed)
 Pt presents to the ED via POV from [facility] clinic for right anterior shoulder dislocation. Pt tripped and fell hitting the floor. Did not hit head. Denies LOC. Pt has a CD with results

## 2023-08-15 DIAGNOSIS — W06XXXA Fall from bed, initial encounter: Secondary | ICD-10-CM | POA: Diagnosis not present

## 2023-08-15 DIAGNOSIS — S43004D Unspecified dislocation of right shoulder joint, subsequent encounter: Secondary | ICD-10-CM | POA: Diagnosis not present

## 2023-08-17 ENCOUNTER — Ambulatory Visit (INDEPENDENT_AMBULATORY_CARE_PROVIDER_SITE_OTHER): Admitting: Orthopedic Surgery

## 2023-08-17 DIAGNOSIS — S42291A Other displaced fracture of upper end of right humerus, initial encounter for closed fracture: Secondary | ICD-10-CM

## 2023-08-17 NOTE — Progress Notes (Unsigned)
 New Patient Visit  Assessment: Janet Phillips is a 73 y.o. female with the following: There are no diagnoses linked to this encounter.  Plan: LEANER MORICI   Follow-up: Return in about 12 days (around 08/29/2023).  Subjective:  Chief Complaint  Patient presents with   Shoulder Pain    Pain in the left shoulder  she tripped and fell and the shoulder popped out of socket. DOI 08/10/2023. She was taking pain oxycodone  she stopped taking It  she is taking tylenol  or advil at the time     History of Present Illness: Janet Phillips is a 73 y.o. female who {Presentation:27320} for evaluation of    Review of Systems: No fevers or chills*** No numbness or tingling No chest pain No shortness of breath No bowel or bladder dysfunction No GI distress No headaches   Medical History:  Past Medical History:  Diagnosis Date   Anemia    Anxiety    Arthritis    Cystocele with incomplete uterovaginal prolapse 06/08/2019   DVT (deep venous thrombosis) (HCC)    GERD (gastroesophageal reflux disease)    Hiatal hernia    Hyperlipemia    IBS (irritable bowel syndrome)    Lichen sclerosus 08/2017   on bx   Uterine prolapse     Past Surgical History:  Procedure Laterality Date   ANKLE SURGERY Left 2010   torn ligament   BREAST BIOPSY Bilateral 2002   neg/ Dr Jacqualine), right done here- neg    BREAST BIOPSY Left 2022   [facility]/neg    BREAST CYST ASPIRATION Left 10/02/2014   cyst asp [facility] 6:00 4cmfn   CYSTOCELE REPAIR N/A 02/17/2021   Procedure: ANTERIOR REPAIR (CYSTOCELE);  Surgeon: Arloa Lamar SQUIBB, MD;  Location: [facility] OR[facility];  Service: Gynecology;  Laterality: N/A;   ESOPHAGOGASTRODUODENOSCOPY (EGD) WITH PROPOFOL  N/A 09/19/2020   Procedure: ESOPHAGOGASTRODUODENOSCOPY (EGD) WITH PROPOFOL ;  Surgeon: Maryruth Ole DASEN, MD;  Location: [facility];  Service: Endoscopy;  Laterality: N/A;   FINE NEEDLE ASPIRATION Left 10-01-14   benign   HAND SURGERY Bilateral 2014    SHOULDER SURGERY Right 2012   torn ligament   VAGINAL HYSTERECTOMY Bilateral 02/17/2021   Procedure: VAGINAL HYSTERECTOMY;  Surgeon: Arloa Lamar SQUIBB, MD;  Location: [facility] OR[facility];  Service: Gynecology;  Laterality: Bilateral;    Family History  Problem Relation Age of Onset   Pancreatic cancer Mother    Diabetes Father    Hypertension Father    Diabetes Sister    Social History   Tobacco Use   Smoking status: Never   Smokeless tobacco: Never  Vaping Use   Vaping status: Never Used  Substance Use Topics   Alcohol use: No    Alcohol/week: 0.0 standard drinks of alcohol   Drug use: No    Allergies  Allergen Reactions   Azithromycin Other (See Comments)    Unknown reaction    Beclomethasone Other (See Comments)    unknown   Doxycycline Nausea Only   Erythromycin Base Nausea And Vomiting   Hydrocodone-Acetaminophen  Nausea Only and Nausea And Vomiting   Lansoprazole Nausea And Vomiting   Levofloxacin In D5w Nausea Only   Penicillin V Potassium Hives   Prednisone Swelling   Sulfa Antibiotics Other (See Comments)    unknown   Amoxicillin Rash   Cefuroxime Axetil Rash   Codeine Palpitations    Current Meds  Medication Sig   acetaminophen  (TYLENOL ) 500 MG tablet Take 500 mg by mouth daily as needed (pain.).   ALPRAZolam  (  XANAX ) 0.5 MG tablet Take 0.5 mg by mouth in the morning and at bedtime.   fluticasone  (FLONASE ) 50 MCG/ACT nasal spray Place into both nostrils as needed for allergies or rhinitis.   omeprazole (PRILOSEC) 40 MG capsule Take 40 mg by mouth in the morning.   oxyCODONE -acetaminophen  (PERCOCET/ROXICET) 5-325 MG tablet Take 1 tablet by mouth every 4 (four) hours as needed for moderate pain.   petrolatum-hydrophilic-aloe vera (ALOE VESTA) ointment Apply topically 2 (two) times daily as needed for wound care. Apply to outer skin around vagina or rectum as needed   phenazopyridine  (PYRIDIUM ) 200 MG tablet Take 1 tablet (200 mg total) by mouth 3 (three) times daily as  needed for pain (urethral spasm).   verapamil  (CALAN -SR) 120 MG CR tablet Take 120 mg by mouth daily after supper.    Objective: There were no vitals taken for this visit.  Physical Exam:  General: {General PE Findings:25791} Gait: {Gait:25792}    IMAGING: {XR Reviewed:24899}   New Medications:  No orders of the defined types were placed in this encounter.     Janet DELENA Horde, MD  08/17/2023 3:21 PM

## 2023-08-18 ENCOUNTER — Encounter: Payer: Self-pay | Admitting: Orthopedic Surgery

## 2023-08-23 ENCOUNTER — Other Ambulatory Visit: Payer: Self-pay | Admitting: Orthopedic Surgery

## 2023-08-23 DIAGNOSIS — S42291A Other displaced fracture of upper end of right humerus, initial encounter for closed fracture: Secondary | ICD-10-CM

## 2023-08-26 ENCOUNTER — Ambulatory Visit
Admission: RE | Admit: 2023-08-26 | Discharge: 2023-08-26 | Disposition: A | Discharge status: Home / Self Care | Attending: Orthopedic Surgery | Admitting: Orthopedic Surgery

## 2023-08-26 ENCOUNTER — Ambulatory Visit
Admission: RE | Admit: 2023-08-26 | Discharge: 2023-08-26 | Disposition: A | Discharge status: Home / Self Care | Source: Ambulatory Visit | Attending: Orthopedic Surgery | Admitting: Orthopedic Surgery

## 2023-08-26 DIAGNOSIS — S42211A Unspecified displaced fracture of surgical neck of right humerus, initial encounter for closed fracture: Secondary | ICD-10-CM | POA: Diagnosis not present

## 2023-08-26 DIAGNOSIS — S42291A Other displaced fracture of upper end of right humerus, initial encounter for closed fracture: Secondary | ICD-10-CM | POA: Diagnosis not present

## 2023-08-26 DIAGNOSIS — I700 Atherosclerosis of aorta: Secondary | ICD-10-CM | POA: Diagnosis not present

## 2023-08-26 DIAGNOSIS — M85811 Other specified disorders of bone density and structure, right shoulder: Secondary | ICD-10-CM | POA: Diagnosis not present

## 2023-08-29 ENCOUNTER — Telehealth: Payer: Self-pay

## 2023-08-29 ENCOUNTER — Ambulatory Visit: Admitting: Orthopedic Surgery

## 2023-08-29 ENCOUNTER — Encounter: Payer: Self-pay | Admitting: Orthopedic Surgery

## 2023-08-29 DIAGNOSIS — S42291A Other displaced fracture of upper end of right humerus, initial encounter for closed fracture: Secondary | ICD-10-CM

## 2023-08-29 NOTE — Telephone Encounter (Signed)
 Spoke to her husband and she went Friday and had xrays

## 2023-08-29 NOTE — Progress Notes (Signed)
 Return patient Visit  Assessment: Janet Phillips is a 73 y.o. female with the following: Right proximal humerus fracture dislocation  Plan: Sharyl A Waltner fell and sustained a right proximal humerus fracture dislocation.  Alignment remains unchanged.  Her pain is improved.  She continues to use a sling.  No numbness or tingling.  She is not taking medications currently.   Follow-up: Return in about 2 weeks (around 09/12/2023).  Subjective:  Chief Complaint  Patient presents with   Shoulder Pain    Patient having pain in the right shoulder  she had a right rotator cuff tear by Dr Fleeta Milks in 02/2010 She said she has no complaints or pain     History of Present Illness: Janet Phillips is a 73 y.o. female who returns for evaluation of right shoulder pain.  She sustained an injury to her right shoulder, almost a month ago.  She sustained a right proximal humerus fracture dislocation.  This was reduced in the emergency department.  She has been using a sling.  Her pain is much better.  She denies numbness and tingling.  She is not taking medications currently.  Review of Systems: No fevers or chills No numbness or tingling No chest pain No shortness of breath No bowel or bladder dysfunction No GI distress No headaches   Objective: There were no vitals taken for this visit.  Physical Exam:  General: Alert and oriented. and No acute distress. Gait: Normal gait.  Improved bruising to the right shoulder.  Motion deferred.  Sensation intact in the axillary nerve distribution.  Fingers are warm and well perfused.  Sensation intact throughout the right hand. 2+ radial pulse.  Mild deformity is palpable.  Fingers warm well-perfused.  IMAGING: I personally reviewed images previously obtained from the ED  X-ray of the right shoulder with fracture of the surgical neck.  Mild angulation.  Shoulder is reduced.  Unchanged alignment overall.  New Medications:  No orders of the  defined types were placed in this encounter.     Oneil DELENA Horde, MD  08/29/2023 2:33 PM

## 2023-08-29 NOTE — Patient Instructions (Addendum)
 Sling at all times when upright  Okay to remove the sling when seated, or in a relaxed position  Medications as needed  Initiate pendulum exercises, several times per day.  Follow-up in 2 weeks    Pendulum   Stand near a wall or a surface that you can hold onto for balance. Bend at the waist and let your left / right arm hang straight down. Use your other arm to support you. Keep your back straight and do not lock your knees. Relax your left / right arm and shoulder muscles, and move your hips and your trunk so your left / right arm swings freely. Your arm should swing because of the motion of your body, not because you are using your arm or shoulder muscles. Keep moving your hips and trunk so your arm swings in the following directions, as told by your health care provider: Side to side. Forward and backward. In clockwise and counterclockwise circles. Continue each motion for 20 seconds, or for as long as told by your health care provider. Slowly return to the starting position.  Repeat 10 times. Complete this exercise daily.

## 2023-09-01 DIAGNOSIS — M955 Acquired deformity of pelvis: Secondary | ICD-10-CM | POA: Diagnosis not present

## 2023-09-01 DIAGNOSIS — M9903 Segmental and somatic dysfunction of lumbar region: Secondary | ICD-10-CM | POA: Diagnosis not present

## 2023-09-01 DIAGNOSIS — M5136 Other intervertebral disc degeneration, lumbar region with discogenic back pain only: Secondary | ICD-10-CM | POA: Diagnosis not present

## 2023-09-01 DIAGNOSIS — M9905 Segmental and somatic dysfunction of pelvic region: Secondary | ICD-10-CM | POA: Diagnosis not present

## 2023-09-13 ENCOUNTER — Telehealth: Payer: Self-pay

## 2023-09-13 DIAGNOSIS — S42291A Other displaced fracture of upper end of right humerus, initial encounter for closed fracture: Secondary | ICD-10-CM

## 2023-09-14 ENCOUNTER — Ambulatory Visit
Admission: RE | Admit: 2023-09-14 | Discharge: 2023-09-14 | Disposition: A | Discharge status: Home / Self Care | Source: Ambulatory Visit | Attending: Orthopedic Surgery

## 2023-09-14 ENCOUNTER — Ambulatory Visit
Admission: RE | Admit: 2023-09-14 | Discharge: 2023-09-14 | Disposition: A | Discharge status: Home / Self Care | Attending: Orthopedic Surgery | Admitting: Orthopedic Surgery

## 2023-09-14 ENCOUNTER — Ambulatory Visit: Admitting: Orthopedic Surgery

## 2023-09-14 ENCOUNTER — Encounter: Payer: Self-pay | Admitting: Orthopedic Surgery

## 2023-09-14 DIAGNOSIS — S42291A Other displaced fracture of upper end of right humerus, initial encounter for closed fracture: Secondary | ICD-10-CM

## 2023-09-14 DIAGNOSIS — S42291D Other displaced fracture of upper end of right humerus, subsequent encounter for fracture with routine healing: Secondary | ICD-10-CM

## 2023-09-14 DIAGNOSIS — S42211D Unspecified displaced fracture of surgical neck of right humerus, subsequent encounter for fracture with routine healing: Secondary | ICD-10-CM | POA: Diagnosis not present

## 2023-09-14 NOTE — Patient Instructions (Signed)
 Pendulum   Stand near a wall or a surface that you can hold onto for balance. Bend at the waist and let your left / right arm hang straight down. Use your other arm to support you. Keep your back straight and do not lock your knees. Relax your left / right arm and shoulder muscles, and move your hips and your trunk so your left / right arm swings freely. Your arm should swing because of the motion of your body, not because you are using your arm or shoulder muscles. Keep moving your hips and trunk so your arm swings in the following directions, as told by your health care provider: Side to side. Forward and backward. In clockwise and counterclockwise circles. Continue each motion for 20 seconds, or for as long as told by your health care provider. Slowly return to the starting position.  Repeat 10 times. Complete this exercise daily.    Rotator Cuff Tear/Tendinitis Rehab   Ask your health care provider which exercises are safe for you. Do exercises exactly as told by your health care provider and adjust them as directed. It is normal to feel mild stretching, pulling, tightness, or discomfort as you do these exercises. Stop right away if you feel sudden pain or your pain gets worse. Do not begin these exercises until told by your health care provider. Stretching and range-of-motion exercises  These exercises warm up your muscles and joints and improve the movement and flexibility of your shoulder. These exercises also help to relieve pain.  Shoulder pendulum In this exercise, you let the injured arm dangle toward the floor and then swing it like a clock pendulum. Stand near a table or counter that you can hold onto for balance. Bend forward at the waist and let your left / right arm hang straight down. Use your other arm to support you and help you stay balanced. Relax your left / right arm and shoulder muscles, and move your hips and your trunk so your left / right arm swings freely. Your  arm should swing because of the motion of your body, not because you are using your arm or shoulder muscles. Keep moving your hips and trunk so your arm swings in the following directions, as told by your health care provider: Side to side. Forward and backward. In clockwise and counterclockwise circles. Slowly return to the starting position. Repeat 10 times, or for 10 seconds per direction. Complete this exercise 2-3 times a day.      Shoulder flexion, seated This exercise is sometimes called table slides. In this exercise, you raise your arm in front of your body until you feel a stretch in your injured shoulder. Sit in a stable chair so your left / right forearm can rest on a flat surface. Your elbow should rest at a height that keeps your upper arm next to your body. Keeping your left / right shoulder relaxed, lean forward at the waist and let your hand slide forward (flexion). Stop when you feel a stretch in your shoulder, or when you reach the angle that is recommended by your health care provider. Hold for 5 seconds. Slowly return to the starting position. Repeat 10 times. Complete this exercise 1-2  times a day.       Shoulder flexion, standing In this exercise, you raise your arm in front of your body (flexion) until you feel a stretch in your injured shoulder. Stand and hold a broomstick, a cane, or a similar object. Place your  hands a little more than shoulder-width apart on the object. Your left / right hand should be palm-up, and your other hand should be palm-down. Keep your elbow straight and your shoulder muscles relaxed. Push the stick up with your healthy arm to raise your left / right arm in front of your body, and then over your head until you feel a stretch in your shoulder. Avoid shrugging your shoulder while you raise your arm. Keep your shoulder blade tucked down toward the middle of your back. Keep your left / right shoulder muscles relaxed. Hold for 10  seconds. Slowly return to the starting position. Repeat 10 times. Complete this exercise 1-2 times a day.      Shoulder abduction, active-assisted You will need a stick, broom handle, or similar object to help you (assist) in doing this exercise. Lie on your back. This is the supine position. Hold a broomstick, a cane, or a similar object. Place your hands a little more than shoulder-width apart on the object. Your left / right hand should be palm-up, and your other hand should be palm-down. Keeping your shoulder relaxed, push the stick to raise your left / right arm out to your side (abduction) and then over your head. Use your other hand to help move the stick. Stop when you feel a stretch in your shoulder, or when you reach the angle that is recommended by your health care provider. Avoid shrugging your shoulder while you raise your arm. Keep your shoulder blade tucked down toward the middle of your back. Hold for 10 seconds. Slowly return to the starting position. Repeat 10 times. Complete this exercise 1-2 times a day.      Shoulder flexion, active-assisted Lie on your back. You may bend your knees for comfort. Hold a broomstick, a cane, or a similar object so that your hands are about shoulder-width apart. Your palms should face toward your feet. Raise your left / right arm over your head, then behind your head toward the floor (flexion). Use your other hand to help you do this (active-assisted). Stop when you feel a gentle stretch in your shoulder, or when you reach the angle that is recommended by your health care provider. Hold for 10 seconds. Use the stick and your other arm to help you return your left / right arm to the starting position. Repeat 10 times. Complete this exercise 1-2 times a day.      External rotation Sit in a stable chair without armrests, or stand up. Tuck a soft object, such as a folded towel or a small ball, under your left / right upper arm. Hold a  broomstick, a cane, or a similar object with your palms face-down, toward the floor. Bend your elbows to a 90-degree angle (right angle), and keep your hands about shoulder-width apart. Straighten your healthy arm and push the stick across your body, toward your left / right side. Keep your left / right arm bent. This will rotate your left / right forearm away from your body (external rotation). Hold for 10 seconds. Slowly return to the starting position. Repeat 10 times. Complete this exercise 1-2 times a day.        Strengthening exercises These exercises build strength and endurance in your shoulder. Endurance is the ability to use your muscles for a long time, even after they get tired. Do not start doing these exercises until your health care provider approves. Shoulder flexion, isometric Stand or sit in a doorway, facing the door  frame. Keep your left / right arm straight and make a gentle fist with your hand. Place your fist against the door frame. Only your fist should be touching the frame. Keep your upper arm at your side. Gently press your fist against the door frame, as if you are trying to raise your arm above your head (isometric shoulder flexion). Avoid shrugging your shoulder while you press your hand into the door frame. Keep your shoulder blade tucked down toward the middle of your back. Hold for 10 seconds. Slowly release the tension, and relax your muscles completely before you repeat the exercise. Repeat 10 times. Complete this exercise 3 times per week.      Shoulder abduction, isometric Stand or sit in a doorway. Your left / right arm should be closest to the door frame. Keep your left / right arm straight, and place the back of your hand against the door frame. Only your hand should be touching the frame. Keep the rest of your arm close to your side. Gently press the back of your hand against the door frame, as if you are trying to raise your arm out to the side  (isometric shoulder abduction). Avoid shrugging your shoulder while you press your hand into the door frame. Keep your shoulder blade tucked down toward the middle of your back. Hold for 10 seconds. Slowly release the tension, and relax your muscles completely before you repeat the exercise. Repeat 10 times. Complete this exercise 3 times per week.      Internal rotation, isometric This is an exercise in which you press your palm against a door frame without moving your shoulder joint (isometric). Stand or sit in a doorway, facing the door frame. Bend your left / right elbow, and place the palm of your hand against the door frame. Only your palm should be touching the frame. Keep your upper arm at your side. Gently press your hand against the door frame, as if you are trying to push your arm toward your abdomen (internal rotation). Gradually increase the pressure until you are pressing as hard as you can. Stop increasing the pressure if you feel shoulder pain. Avoid shrugging your shoulder while you press your hand into the door frame. Keep your shoulder blade tucked down toward the middle of your back. Hold for 10 seconds. Slowly release the tension, and relax your muscles completely before you repeat the exercise. Repeat 10 times. Complete this exercise 3 times per week.      External rotation, isometric This is an exercise in which you press the back of your wrist against a door frame without moving your shoulder joint (isometric). Stand or sit in a doorway, facing the door frame. Bend your left / right elbow and place the back of your wrist against the door frame. Only the back of your wrist should be touching the frame. Keep your upper arm at your side. Gently press your wrist against the door frame, as if you are trying to push your arm away from your abdomen (external rotation). Gradually increase the pressure until you are pressing as hard as you can. Stop increasing the pressure if you  feel pain. Avoid shrugging your shoulder while you press your wrist into the door frame. Keep your shoulder blade tucked down toward the middle of your back. Hold for 10 seconds. Slowly release the tension, and relax your muscles completely before you repeat the exercise. Repeat 10 times. Complete this exercise 3 times per week.  Scapular retraction Sit in a stable chair without armrests, or stand up. Secure an exercise band to a stable object in front of you so the band is at shoulder height. Hold one end of the exercise band in each hand. Your palms should face down. Squeeze your shoulder blades together (retraction) and move your elbows slightly behind you. Do not shrug your shoulders upward while you do this. Hold for 10 seconds. Slowly return to the starting position. Repeat 10 times. Complete this exercise 3 times per week.      Shoulder extension Sit in a stable chair without armrests, or stand up. Secure an exercise band to a stable object in front of you so the band is above shoulder height. Hold one end of the exercise band in each hand. Straighten your elbows and lift your hands up to shoulder height. Squeeze your shoulder blades together and pull your hands down to the sides of your thighs (extension). Stop when your hands are straight down by your sides. Do not let your hands go behind your body. Hold for 10 seconds. Slowly return to the starting position. Repeat 10 times. Complete this exercise 3 times per week.       Scapular protraction, supine Lie on your back on a firm surface (supine position). Hold a 5 lbs (or soup can) weight in your left / right hand. Raise your left / right arm straight into the air so your hand is directly above your shoulder joint. Push the weight into the air so your shoulder (scapula) lifts off the surface that you are lying on. The scapula will push up or forward (protraction). Do not move your head, neck, or back. Hold for 10  seconds. Slowly return to the starting position. Let your muscles relax completely before you repeat this exercise.  Repeat 10 times. Complete this exercise 3 times per week.

## 2023-09-14 NOTE — Progress Notes (Unsigned)
 Return patient Visit  Assessment: Janet Phillips is a 73 y.o. female with the following: Right proximal humerus fracture dislocation  Plan: Janet Phillips fell and sustained a right proximal humerus fracture dislocation.  Overall alignment remains unchanged on most recent radiographs.  Continue to advance her activities.  Initiate passive range of motion and use of the sling.  Would like to see her back in approximately 6 weeks.  Follow-up: Return in about 6 weeks (around 10/26/2023).  Subjective:  Chief Complaint  Patient presents with   Shoulder Pain    Right shoulder pain little ROM  husband says she is using that arm and hand more     History of Present Illness: Janet Phillips is a 73 y.o. female who returns for evaluation of right shoulder pain.  She sustained a right proximal humerus fracture dislocation, approximately 6 weeks ago.  This was reduced in the emergency department.  She has done well.  Her pain is improving.  She started using her arm more.  She has been some time out of the sling.  No numbness or tingling.   Review of Systems: No fevers or chills No numbness or tingling No chest pain No shortness of breath No bowel or bladder dysfunction No GI distress No headaches   Objective: There were no vitals taken for this visit.  Physical Exam:  General: Alert and oriented. and No acute distress. Gait: Normal gait.  Right shoulder without swelling.  No bruising is appreciated.  No obvious deformity.  She tolerates passive forward flexion to 100 degrees.  Passive abduction to 90 degrees.  Fingers are warm and well-perfused.  Sensation is intact in the axillary nerve distribution.  IMAGING: I personally reviewed images previously obtained from the ED  X-ray of the right shoulder was obtained and compared to prior x-rays.  Fracture of the surgical neck remains a stable alignment.  Shoulder is reduced.  No additional injuries.   New Medications:  No orders  of the defined types were placed in this encounter.     Oneil DELENA Horde, MD  09/14/2023 2:58 PM

## 2023-09-29 DIAGNOSIS — M9903 Segmental and somatic dysfunction of lumbar region: Secondary | ICD-10-CM | POA: Diagnosis not present

## 2023-09-29 DIAGNOSIS — M955 Acquired deformity of pelvis: Secondary | ICD-10-CM | POA: Diagnosis not present

## 2023-09-29 DIAGNOSIS — M5136 Other intervertebral disc degeneration, lumbar region with discogenic back pain only: Secondary | ICD-10-CM | POA: Diagnosis not present

## 2023-09-29 DIAGNOSIS — M9905 Segmental and somatic dysfunction of pelvic region: Secondary | ICD-10-CM | POA: Diagnosis not present

## 2023-10-25 ENCOUNTER — Other Ambulatory Visit: Payer: Self-pay

## 2023-10-25 DIAGNOSIS — S42291D Other displaced fracture of upper end of right humerus, subsequent encounter for fracture with routine healing: Secondary | ICD-10-CM

## 2023-10-27 ENCOUNTER — Ambulatory Visit: Admitting: Orthopedic Surgery

## 2023-10-27 DIAGNOSIS — M5136 Other intervertebral disc degeneration, lumbar region with discogenic back pain only: Secondary | ICD-10-CM | POA: Diagnosis not present

## 2023-10-27 DIAGNOSIS — M9905 Segmental and somatic dysfunction of pelvic region: Secondary | ICD-10-CM | POA: Diagnosis not present

## 2023-10-27 DIAGNOSIS — M9903 Segmental and somatic dysfunction of lumbar region: Secondary | ICD-10-CM | POA: Diagnosis not present

## 2023-10-27 DIAGNOSIS — H903 Sensorineural hearing loss, bilateral: Secondary | ICD-10-CM | POA: Diagnosis not present

## 2023-10-27 DIAGNOSIS — S42291D Other displaced fracture of upper end of right humerus, subsequent encounter for fracture with routine healing: Secondary | ICD-10-CM

## 2023-10-27 DIAGNOSIS — M955 Acquired deformity of pelvis: Secondary | ICD-10-CM | POA: Diagnosis not present

## 2023-10-27 DIAGNOSIS — H6123 Impacted cerumen, bilateral: Secondary | ICD-10-CM | POA: Diagnosis not present

## 2023-10-28 ENCOUNTER — Encounter: Payer: Self-pay | Admitting: Orthopedic Surgery

## 2023-10-28 NOTE — Progress Notes (Signed)
 Return patient Visit  Assessment: Janet Phillips is a 73 y.o. female with the following: Right proximal humerus fracture dislocation  Plan: Janet Phillips fell and sustained a right proximal humerus fracture dislocation.  We are unable to obtain new x-rays today, but she continues to improve.  She is not complaining of any pain.  Passively, beyond 100 degrees forward flexion.  I degrees of abduction.  Overall, I think she has done quite well.  If her pain worsens, they note any change in her symptoms, I am happy to see her in clinic.  Otherwise, I will see her as needed.   Follow-up: Return if symptoms worsen or fail to improve.  Subjective:  Chief Complaint  Patient presents with   Right shoulder pain    Right shoulder fracture dislocation    History of Present Illness: Janet Phillips is a 73 y.o. female who returns for evaluation of right shoulder pain.  She sustained a fracture dislocation proximal humerus, greater than 2 months ago.  She has done well.  She is progressively using the arm more consistently.  According to her husband, she is not complaining of pain.  He has become more functional.   Review of Systems: No fevers or chills No numbness or tingling No chest pain No shortness of breath No bowel or bladder dysfunction No GI distress No headaches   Objective: There were no vitals taken for this visit.  Physical Exam:  General: Alert and oriented. and No acute distress. Gait: Normal gait.  Right shoulder without swelling.  No bruising is appreciated.  No obvious deformity.  She tolerates passive forward flexion to 110 degrees.  Passive abduction to 100 degrees.  Fingers are warm and well-perfused.  Sensation is intact in the axillary nerve distribution.  IMAGING: No new imaging obtained today    New Medications:  No orders of the defined types were placed in this encounter.     Oneil DELENA Horde, MD  10/28/2023 1:05 PM

## 2023-11-24 DIAGNOSIS — M955 Acquired deformity of pelvis: Secondary | ICD-10-CM | POA: Diagnosis not present

## 2023-11-24 DIAGNOSIS — M9903 Segmental and somatic dysfunction of lumbar region: Secondary | ICD-10-CM | POA: Diagnosis not present

## 2023-11-24 DIAGNOSIS — M5136 Other intervertebral disc degeneration, lumbar region with discogenic back pain only: Secondary | ICD-10-CM | POA: Diagnosis not present

## 2023-11-24 DIAGNOSIS — M9905 Segmental and somatic dysfunction of pelvic region: Secondary | ICD-10-CM | POA: Diagnosis not present

## 2023-12-19 ENCOUNTER — Encounter: Payer: Self-pay | Admitting: Radiology

## 2023-12-22 DIAGNOSIS — M5136 Other intervertebral disc degeneration, lumbar region with discogenic back pain only: Secondary | ICD-10-CM | POA: Diagnosis not present

## 2023-12-22 DIAGNOSIS — I10 Essential (primary) hypertension: Secondary | ICD-10-CM | POA: Diagnosis not present

## 2023-12-22 DIAGNOSIS — R7303 Prediabetes: Secondary | ICD-10-CM | POA: Diagnosis not present

## 2023-12-22 DIAGNOSIS — E782 Mixed hyperlipidemia: Secondary | ICD-10-CM | POA: Diagnosis not present

## 2023-12-22 DIAGNOSIS — M955 Acquired deformity of pelvis: Secondary | ICD-10-CM | POA: Diagnosis not present

## 2023-12-22 DIAGNOSIS — E079 Disorder of thyroid, unspecified: Secondary | ICD-10-CM | POA: Diagnosis not present

## 2023-12-22 DIAGNOSIS — M9903 Segmental and somatic dysfunction of lumbar region: Secondary | ICD-10-CM | POA: Diagnosis not present

## 2023-12-22 DIAGNOSIS — E538 Deficiency of other specified B group vitamins: Secondary | ICD-10-CM | POA: Diagnosis not present

## 2023-12-22 DIAGNOSIS — M9905 Segmental and somatic dysfunction of pelvic region: Secondary | ICD-10-CM | POA: Diagnosis not present

## 2023-12-29 DIAGNOSIS — D509 Iron deficiency anemia, unspecified: Secondary | ICD-10-CM | POA: Diagnosis not present

## 2023-12-29 DIAGNOSIS — F411 Generalized anxiety disorder: Secondary | ICD-10-CM | POA: Diagnosis not present

## 2023-12-29 DIAGNOSIS — R7303 Prediabetes: Secondary | ICD-10-CM | POA: Diagnosis not present

## 2023-12-29 DIAGNOSIS — Z23 Encounter for immunization: Secondary | ICD-10-CM | POA: Diagnosis not present

## 2023-12-29 DIAGNOSIS — R600 Localized edema: Secondary | ICD-10-CM | POA: Diagnosis not present

## 2023-12-29 DIAGNOSIS — E782 Mixed hyperlipidemia: Secondary | ICD-10-CM | POA: Diagnosis not present

## 2023-12-29 DIAGNOSIS — I10 Essential (primary) hypertension: Secondary | ICD-10-CM | POA: Diagnosis not present

## 2023-12-29 DIAGNOSIS — Z2821 Immunization not carried out because of patient refusal: Secondary | ICD-10-CM | POA: Diagnosis not present

## 2023-12-29 DIAGNOSIS — E559 Vitamin D deficiency, unspecified: Secondary | ICD-10-CM | POA: Diagnosis not present

## 2023-12-29 DIAGNOSIS — Z1231 Encounter for screening mammogram for malignant neoplasm of breast: Secondary | ICD-10-CM | POA: Diagnosis not present

## 2023-12-29 DIAGNOSIS — E538 Deficiency of other specified B group vitamins: Secondary | ICD-10-CM | POA: Diagnosis not present

## 2023-12-29 DIAGNOSIS — E079 Disorder of thyroid, unspecified: Secondary | ICD-10-CM | POA: Diagnosis not present

## 2024-01-19 DIAGNOSIS — H6123 Impacted cerumen, bilateral: Secondary | ICD-10-CM | POA: Diagnosis not present

## 2024-01-19 DIAGNOSIS — M9903 Segmental and somatic dysfunction of lumbar region: Secondary | ICD-10-CM | POA: Diagnosis not present

## 2024-01-19 DIAGNOSIS — M5136 Other intervertebral disc degeneration, lumbar region with discogenic back pain only: Secondary | ICD-10-CM | POA: Diagnosis not present

## 2024-01-19 DIAGNOSIS — M955 Acquired deformity of pelvis: Secondary | ICD-10-CM | POA: Diagnosis not present

## 2024-01-19 DIAGNOSIS — H903 Sensorineural hearing loss, bilateral: Secondary | ICD-10-CM | POA: Diagnosis not present

## 2024-01-19 DIAGNOSIS — M9905 Segmental and somatic dysfunction of pelvic region: Secondary | ICD-10-CM | POA: Diagnosis not present
